# Patient Record
Sex: Male | Born: 1985 | Race: White | Hispanic: No | Marital: Married | Smoking: Never smoker
Health system: Southern US, Community
[De-identification: ages and names within clinical notes are randomized; demographics above are authoritative.]

## PROBLEM LIST (undated history)

## (undated) DIAGNOSIS — J984 Other disorders of lung: Secondary | ICD-10-CM

## (undated) DIAGNOSIS — J45909 Unspecified asthma, uncomplicated: Secondary | ICD-10-CM

## (undated) DIAGNOSIS — J302 Other seasonal allergic rhinitis: Secondary | ICD-10-CM

## (undated) HISTORY — PX: OTHER SURGICAL HISTORY: SHX169

## (undated) HISTORY — DX: Other seasonal allergic rhinitis: J30.2

## (undated) HISTORY — PX: HX FOOT SURGERY: 2100001154

## (undated) HISTORY — DX: Other disorders of lung: J98.4

---
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
## 2009-01-09 ENCOUNTER — Inpatient Hospital Stay
Admission: EM | Admit: 2009-01-09 | Discharge: 2009-01-10 | DRG: 101 | Disposition: A | Discharge status: Home / Self Care | Payer: Self-pay | Attending: Neurology | Admitting: Neurology

## 2009-01-09 ENCOUNTER — Emergency Department (EMERGENCY_DEPARTMENT_HOSPITAL): Payer: Self-pay

## 2009-01-09 ENCOUNTER — Encounter (HOSPITAL_COMMUNITY): Payer: Self-pay | Admitting: Neurology

## 2009-01-09 ENCOUNTER — Encounter (HOSPITAL_COMMUNITY): Payer: Self-pay | Admitting: Nurse Practitioner

## 2009-01-09 LAB — VENOUS BLOOD GAS/K+/ICA++/CO-OX - INACTIVE
%FIO2: 100 %
%FIO2: 21 %
BASE DEFICIT: 24.9 mmol/L — ABNORMAL HIGH (ref 0.0–2.0)
BASE DEFICIT: 11.1 mmol/L — ABNORMAL HIGH (ref 0.0–2.0)
BICARBONATE: 5.4 mmol/L — CL (ref 22.0–26.0)
BICARBONATE: 14.8 mmol/L — CL (ref 22.0–26.0)
CARBOHYHEMOGLOBIN: 2.5 % — ABNORMAL HIGH (ref 0.0–1.5)
CARBOHYHEMOGLOBIN: 2.2 % — ABNORMAL HIGH (ref 0.0–1.5)
HEMOGLOBIN: 17.4 g/dL (ref 13.5–18.0)
HEMOGLOBIN: 16 g/dL (ref 13.5–18.0)
IONIZED CALCIUM: 1.32 mmol/L (ref 1.30–1.46)
IONIZED CALCIUM: 1.16 mmol/L — ABNORMAL LOW (ref 1.30–1.46)
MET-HEMOGLOBIN: 1.1 % (ref 0.0–3.0)
MET-HEMOGLOBIN: 0.1 % (ref 0.0–3.0)
O2CT: 23.5 — ABNORMAL HIGH (ref 7.5–17.5)
O2CT: 11.5 (ref 7.5–17.5)
O2HB: 94.6 % (ref 40.0–70.0)
O2HB: 51.4 % (ref 40.0–70.0)
PH: 6.87 — CL (ref 7.310–7.410)
PH: 7.18 — CL (ref 7.310–7.410)
PO2: 195 mmHg — ABNORMAL HIGH (ref 35–50)
PO2: 33 mmHg — ABNORMAL LOW (ref 35–50)
WHOLE BLOOD K+: 4 mmol/L (ref 3.0–5.0)
WHOLE BLOOD K+: 4.5 mmol/L (ref 3.0–5.0)

## 2009-01-09 LAB — URINALYSIS WITH MICROSCOPIC REFLEX IF INDICATED
BILIRUBIN: NEGATIVE
GLUCOSE: NEGATIVE mg/dL
KETONES: 10 mg/dL — AB
LEUKOCYTES: NEGATIVE
NITRITE: NEGATIVE
PH URINE: 5 (ref 5.0–8.0)
PROTEIN: 30 mg/dL — AB
SPECIFIC GRAVITY, URINE: 1.011 (ref 1.005–1.030)
UROBILINOGEN: NORMAL mg/dL

## 2009-01-09 LAB — DRUG SCREEN, HIGH OPIATE CUTOFF, NO CONFIRMATION, URINE
AMPHETAMINE, URINE: NEGATIVE
BARBITURATE, URINE: NEGATIVE
BENZODIAZEPINE,URINE: NEGATIVE
CANNABINOID (THC), URINE: POSITIVE — AB
COCAINE METAB. URINE: NEGATIVE
METHADONE, URINE: NEGATIVE
OPIATE, URINE: NEGATIVE
PHENCYCLIDINE, URINE: NEGATIVE
PROPOXYPHENE, URINE: NEGATIVE

## 2009-01-09 LAB — CBC/DIFF
BANDS: 1 % (ref 0–5)
BASOS ABS: 0 THOU/uL (ref 0.0–0.2)
EOS ABS: 0 THOU/uL — ABNORMAL LOW (ref 0.1–0.3)
EOSINOPHIL: 0 % — ABNORMAL LOW (ref 1–6)
HCT: 52.4 % — ABNORMAL HIGH (ref 39.8–50.2)
HGB: 16.9 g/dL (ref 13.1–17.3)
LYMPHOCYTES: 17 % — ABNORMAL LOW (ref 20–45)
LYMPHS ABS: 4.029 THOU/uL (ref 1.0–4.8)
MCH: 30.3 pg (ref 27.4–33.0)
MCHC: 32.3 g/dL (ref 31.6–35.5)
MCV: 93.9 fL (ref 82.0–99.0)
MONOS ABS: 1.659 THOU/uL — ABNORMAL HIGH (ref 0.1–0.9)
MPV: 8.5 FL (ref 7.4–10.4)
PLATELET COUNT: 258 THO/UL (ref 140–450)
PMN ABS: 17.775 THOU/uL — ABNORMAL HIGH (ref 1.5–7.7)
PMN'S: 75 % (ref 40–75)
RBC: 5.59 MIL/uL (ref 4.46–5.70)
TOTAL CELL COUNT: 200
WBC: 23.7 THOU/UL — ABNORMAL HIGH (ref 3.5–11.0)

## 2009-01-09 LAB — PT/INR
INR: 1.1 (ref 0.8–1.2)
PROTHROMBIN TIME: 11.1 s (ref 9.1–11.2)

## 2009-01-09 LAB — BASIC METABOLIC PANEL
ANION GAP: 20 mmol/L — ABNORMAL HIGH (ref 5–16)
BUN: 8 mg/dL (ref 8–26)
CARBON DIOXIDE: 11 mmol/L — ABNORMAL LOW (ref 23–33)
GLUCOSE,NONFAST: 127 mg/dL (ref 65–139)

## 2009-01-09 LAB — URINALYSIS, MICROSCOPIC
RBC'S: NONE SEEN /HPF (ref 0–4)
WBC'S: 1 /HPF (ref 0–1)

## 2009-01-09 LAB — ALT (SGPT): ALT (SGPT): 18 U/L (ref 7–55)

## 2009-01-09 LAB — AST (SGOT): AST (SGOT): 28 U/L (ref 8–48)

## 2009-01-09 MED ORDER — LORAZEPAM 2 MG/ML INJECTION SOLUTION
INTRAMUSCULAR | Status: AC
Start: 2009-01-09 — End: 2009-01-09
  Administered 2009-01-09: 1 mg via INTRAMUSCULAR
  Filled 2009-01-09: qty 1

## 2009-01-09 MED ORDER — ONDANSETRON HCL (PF) 4 MG/2 ML INJECTION SOLUTION
INTRAMUSCULAR | Status: AC
Start: 2009-01-09 — End: 2009-01-09
  Administered 2009-01-09: 4 mg via INTRAMUSCULAR
  Filled 2009-01-09: qty 2

## 2009-01-09 MED ORDER — PHENOBARBITAL 30 MG TABLET
30.0000 mg | ORAL_TABLET | Freq: Two times a day (BID) | ORAL | Status: DC
Start: 2009-01-09 — End: 2009-01-10
  Administered 2009-01-09: 30 mg via ORAL
  Filled 2009-01-09 (×3): qty 1

## 2009-01-09 MED ORDER — ONDANSETRON HCL (PF) 4 MG/2 ML INJECTION SOLUTION
4.0000 mg | Freq: Once | INTRAMUSCULAR | Status: AC
Start: 2009-01-09 — End: 2009-01-09

## 2009-01-09 MED ORDER — PHENOBARBITAL SODIUM 130 MG/ML INJECTION SYRINGE
100.0000 mg | INJECTION | Freq: Once | INTRAMUSCULAR | Status: AC
Start: 2009-01-09 — End: 2009-01-09
  Administered 2009-01-09: 100 mg via INTRAVENOUS
  Filled 2009-01-09 (×2): qty 0.77

## 2009-01-09 MED ORDER — LORAZEPAM 2 MG/ML INJECTION SOLUTION
1.0000 mg | Freq: Once | INTRAMUSCULAR | Status: AC
Start: 2009-01-09 — End: 2009-01-09

## 2009-01-09 NOTE — ED Nurses Note (Signed)
Pt's mother was yelling for help, pt was actively seizing.  Pt was placed on non-rebreather and was suctioned. Pt was ordered and given 1mg  of Ativan IV per Dr. Dawson Bills.  Pt was moved to monitored room and pt report was given to Synetta Shadow RN.  Pt's mother is at bedside and pt and family denies any needs at this time.

## 2009-01-09 NOTE — ED Provider Notes (Signed)
HPI  chief complaint  Seizure    Pt with first seizure 1.5 year ago.  Initally put on depakote.  No imaging per family.  Has not seen neurologist.  Stopped his meds 1 year ago.  Last sz was 1 month ago.  Today had 2 sz.  Similar to past.  First lasted 10 minutes.  Soon after had second that lasted 1 minute.  Full body convulsions per family.  Post ictal after with some nausea.         ROS  Review of systems:    General: No fever, chills, night sweats  HEENT:  No headache, change in vision  CHEST:  No chest pain, sob,   Heart:  No Palpitations, diaphoresis, cough  GI:  No  diarrhea  GU:  No dysuria, hematuria,   Skin:  No rash,    MS:  No swollen joints  Psych:  No homicidal ideation  All other systems reviewed and negative      History: Past Medical History:  Past Medical History   Diagnosis Date   . Seizures          Past Surgical History:  History reviewed.  No pertinent past surgical history.    Social History:  History   Substance Use Topics   . Tobacco Use: Yes -- 1.0 packs/day for 3 years   . Alcohol Use: No       History   Drug Use No         Family History:  History reviewed.  No pertinent family history.          Physical Exam  PHysical:    Constitutional: Blood pressure 125/60, pulse 74, temperature 37.6 C (99.7 F), resp. rate 18, height 1.829 m (6'), weight 93.8 kg (206 lb 12.7 oz), SpO2 97%.    General:  Well appearing, no acute distress  HEENT:  Normocephalic, mucous membranes moist, conjunctiva normal   Neck:  Supple, no masses, trachea midline  Chest: no deformities,    Respiratory:  Nonlabored breathing, equal lung sounds, CTABL,   CV:  Regular rate and rythym,  Distal pulses intact, abdominal aorta wnl  ABD: soft, nontender, nondistented, no masses  MS:  Normal rom, no deformities, normal tone   Neuro:  Pupils equal, no facial asymetry, normal speech, movement x4 ext  Psych:  Normal affect,  Linear thought  Skin: warm, no palor          Course  Orders Placed This Encounter    . Ct brain wo iv contrast   . Venous blood gas/k+/ica++/coox   . Cbc/diff   . Basic metabolic panel, non-fasting   . Ast (sgot)   . Alt (sgpt)   . Pt/inr   . Urine drug screen complete   . Urinalysis w/culture (inpt routine)   . Venous blood gas/k+/ica++/coox   . Microscopic urinalysis   . Patient class/level of care designation   . Lorazepam 2 mg/ml injection   . Lorazepam 2 mg/ml injection   . Ondansetron hcl (pf) 4 mg/2 ml injection   . Ondansetron hcl (pf) 4 mg/2 ml injection   . Phenobarbital sodium 130 mg/ml syringe   . Phenobarbital 30 mg tab         Labs reviewed  Imaging reviewed  Consults neurology    Pt with witnessed seizure in ed.  Given 1mg  ativan.  Resolved.  Ct brain negative.  Labs abn but can be explained by sz.  Neurology consulted    Pt care  to dr Artis Flock with admit pending.

## 2009-01-09 NOTE — ED Resident Handoff Note (Signed)
Care of patient assumed from Dr. Maryjane Hurter at 5:37 PM with CT and neuro recs pending prior to disposition.    23 yo M presenting with seizure. Has seizure history, off meds 1 year. 2 seizures today and 1 in ED. Had 1 mg ativan. Neuro consulted. CT brain pending. Allergic to dilantin. Previously on depakote.   CT with sinus disease. Admitted to neuro. Stable throughout ED course.     Estill Bakes, MD 01/09/2009, 5:37 PM

## 2009-01-09 NOTE — Nurses Notes (Signed)
Received report from ED RN Irving Burton. Patient arrived on unit via bed accompanied by RN/Mother. Patient in stable condition. Mother stated that his seizures consist of him "holding his breath and turning purple." She stated that he would shake his extremities and his mouth will foam. He was incontinent of urine in the ED here at [facility]. Frequency varies but they are more frequent now. Durations varies but mother stated usually it is anywhere from 30 sec. To a few minutes. Patient is confused after seizure, drowsy, and develops a headache. Seizures were diagnosed a few years ago and he was on Depakote but stopped the medicine over a year ago. Most recent seizure was this afternoon in [facility] ED. Assessment complete per flowsheet. VSS. SATS 95-100% on RA. Notified service if patient needed to have EEG monitoring. Service stated that he doesn't want him on video EEG because he is a "known diagnosis." Oriented family member and patient to the room, bed controls and how to notify me if he does have a seizure. They verbalized understanding. Family member stated she is staying with him tonight. Seizure precautions in place. Patient only complaining of a headache 8/10 on pain scale but declines any pain medication at this time. Will cont. To monitor.

## 2009-01-09 NOTE — ED Nurses Note (Signed)
Initial nursing assessment complete. Pt resting in bed with family at bedside. Pt was seen at [facility] and stated he signed out AMA and states he got home and called an Ambulance to bring him here. Pt states he had a seizure this morning that was witnessed and lasted 10 seconds. Pt was vomiting when he came here and denies any pain.  Pt is conscious, alert and oriented to person, place and time.  Pt is waiting for physician evaluation.

## 2009-01-09 NOTE — H&P (Addendum)
[facility]  Neurology Admission  History and Physical    [name],[name], 23 y.o. male  Date of Admission:  01/09/2009  Date of Birth:  18-Nov-1985    PCP: No Established Pcp    Information obtained from: patient, mother, father   Chief Complaint:  seizure    [id number] [name] [name] is a 23 y.o., White male who presents with 3 episode of seizure-like episodes.  First episode, at 11:00 AM on 01/09/09, where patient woke from asleep and had shaking of arms.  He also had foaming at the mouth.  This episode lasted 10 minutes.  He was confused afterwards.  He presented to hospital in [place], [place] and was unhappy with care and left.  Had a second episode at 1:00 PM where his arms went stiff and began shaking.  His eyes rolled to the back of his head.  This episode lasted 3 minutes.  He was again confused after this episode.  Third episode was in the [facility] ED, where patient's arms went stiff had foaming at the mouth and had urinary incontinence. Denies recent infection.   He was diagnosed with seizures 1.5 years ago and was on Depakote 500 mg BID in the past.  However, he stopped taking this over a year ago, due to financial reasons and he did not feel himself when on this medication.  He had a seizure 1 month ago and was started on Dilantin and developed a rash.  He did not start any AED at that time. UDS positive for cannabis.        Past Medical History   Diagnosis Date   . Seizures        PSH - denies      Medications - denies        Allergies   Allergen Reactions   . Dilantin (Phenytoin)    . Tussin (Guaifenesin)        History   Substance Use Topics   . Tobacco Use: Yes -- 1.0 packs/day   . Alcohol Use: No       Family History - Mother - cancer  Father -denies  Brother - seizure disorder        ROS: Other than ROS in the HPI, all other systems were negative.    Exam:  Temperature: 37.1 C (98.8 F)  Heart Rate: 79   BP (Non-Invasive): 130/86 mmHg  Respiratory Rate: 18   SpO2-1: 97 %   General: no distress  HENT:Head atraumatic and normocephalic  Neck: No JVD  Carotids:Carotids normal without bruit  Lungs: Clear to auscultation bilaterally.   Cardiovascular: regular rate and rhythm  Abdomen: Soft, non-tender  Extremities: No cyanosis or edema  Ophthalomscopic: normal w/o hemorrhages, exudates, or papilledema  Mental status:  Level of Consciousness: alert  Orientations: To person and place, did not know year  Memory Registration, Recall, and Following of commands is normal  AttentionsAttention and Concentration are normal  Knowledge: Good  Language: Normal  Speech: Normal  Cranial nerves:   CN2: Visual acuity and fields intact  CN 3,4,6: EOMI, PERRLA  CN 5Facial sensation intact  CN 7Face symmetrical  CN 8: Hearing grossly intact  CN 9,10: Palate symmetric and gag normal  CN 11: Sternocleidomastoid and Trapezius have normal strength.  CN 12: Tongue normal with no fasiculations or deviation  Gait, Coordination, and Reflexes:   Gait: Normal  Coordination: Coordination is normal without tremor  Reflexes: symmetric bilaterally  Motor:   Muscle tone:  Upper and lower muscle  tone is normal  Motor strength:  Motor strength is normal throughout.  Sensory: Sensory exam in the upper and lower extremities is normal  Labs:      BMP:     Lab Results   Basename Value Date/Time   . SODIUM 138 01/09/09 1515   . POTASSIUM 3.6 01/09/09 1515   . CHLORIDE 107 01/09/09 1515   . CO2 11 01/09/09 1515   . BUN 8 01/09/09 1515   . CREATININE 1.13 01/09/09 1515   . GLUCOSENF 127 01/09/09 1515   . ANIONGAP 20 01/09/09 1515   . BUNCRRATIO 7 01/09/09 1515   . GFR >59 01/09/09 1515   . CALCIUM 9.4 01/09/09 1515     CBC with Differential:     Lab Results   Basename Value Date/Time   . WBC 23.7 01/09/09 1515   . HGB 16.9 01/09/09 1515   . HCT 52.4 01/09/09 1515   . PLTCNT 258 01/09/09 1515   . RBC 5.59 01/09/09 1515   . MCV 93.9 01/09/09 1515   . MCHC 32.3 01/09/09 1515   . MCH 30.3 01/09/09 1515   . RDW 12.2 01/09/09 1515   . MPV 8.5 01/09/09 1515    . PMNS 75 01/09/09 1515   . LYMPHOCYTES 17 01/09/09 1515   . EOSINOPHIL 0 01/09/09 1515   . MONOCYTES 7 01/09/09 1515   . BASOPHILS 0 01/09/09 1515   . BANDS 1 01/09/09 1515         Review of reports and notes reveal:     Independent Interpretation of images or specimens:  CT Brain  No acute process    DNR Status:  Full Code    Assessment/Plan:  23 year old male with  uncontrolled likely Tonic-Clonic seizures.   - Admit patient to Neurology service.   - Seizure precautions   - Will start Phenobarbital 100 mg IV   - Will start Phenobarbital 30 mg BID   - Seizure Precautions     2. Leukocytosis   - Likely related to seizure    2. Diet   - Regular        DVT/PE Prophylaxis: not needed    Uvaldo Bristle, MD 01/09/2009, 6:33 PM    Uvaldo Bristle, MD  PGY - 2 Department of Neurology  [facility]      I saw and examined the patient.  I reviewed the resident's note.  I agree with the findings and plan of care as documented in the resident's note.  Any exceptions/additions are edited/noted.    Jamse Arn, MD 01/10/2009, 11:36 AM

## 2009-01-09 NOTE — ED Attending Note (Signed)
Note begun by:  Danise Mina, MD 01/09/2009, 3:15 PM    I was physically present and directly supervised this patient's care.  Patient seen and examined.  Resident history and exam reviewed.  Key elements in addition to and/or correction of that documentation are as follows:    HPI:    23 y.o. male presents for seizure activity.  Occurred X 2 today.  H/o sz approx 1.5 years ago and was seen in an ED at [facility] and tx with dilantin.  Not taking dilantin since.  Denies any trauma, headache, or ext weakness. Further historical details can be found in the resident's note.    PE:   VS on presentation: Blood pressure 130/86, pulse 79, temperature 37.1 C (98.8 F), resp. rate 18, weight 83.915 kg (185 lb), SpO2 97%.    Data/Tests:  Labs:    Results for orders placed during the hospital encounter of 01/09/2009 (from the past 12 hours)   URINE DRUG SCREEN COMPLETE   Component Value Range   . AMPHETAMINE, URINE NEGATIVE  NEGATIVE    . BARBITURATE, URINE NEGATIVE  NEGATIVE    . BENZODIAZEPINE,URINE NEGATIVE  NEGATIVE    . COCAINE METAB. URINE NEGATIVE  NEGATIVE    . METHADONE, URINE NEGATIVE  NEGATIVE    . OPIATE, URINE NEGATIVE  NEGATIVE    . PHENCYCLIDINE, URINE NEGATIVE  NEGATIVE    . PROPOXYPHENE, URINE NEGATIVE  NEGATIVE    . CANNABINOID (THC), URINE POSITIVE (*) NEGATIVE    URINALYSIS W/CULTURE (INPT ROUTINE)   Component Value Range   . CHARACTER CLEAR     . COLOR LIGHT YELLOW     . SPECIFIC GRAVITY, URINE 1.011  1.005 - 1.030    . GLUCOSE NEGATIVE  NEGATIVE (mg/dL)   . BILIRUBIN NEGATIVE  NEGATIVE    . KETONES 10 (*) NEGATIVE (mg/dL)   . BLOOD SMALL (*) NEGATIVE    . PH URINE 5.0  5.0 - 8.0    . PROTEIN 30 (*) NEGATIVE (mg/dL)   . UROBILINOGEN NORMAL  0.2 (mg/dL)   . NITRITE NEGATIVE  NEGATIVE    . LEUKOCYTES NEGATIVE  NEGATIVE    VENOUS BLOOD GAS/K+/ICA++/COOX   Component Value Range   . WHOLE BLOOD K+ 4.5  3.0 - 5.0 (mmol/L)   . IONIZED CALCIUM 1.16 (*) 1.30 - 1.46 (mmol/L)   . %FIO2 21  (%)    . PH 7.180 (*) 7.310 - 7.410    . PCO2 46.0  41.0 - 51.0 (mm Hg)   . PO2 33 (*) 35 - 50 (mm Hg)   . BICARBONATE 14.8 (*) 22.0 - 26.0 (mmol/L)   . BASE EXCESS Test Not Performed  0.0 - 2.0 (mmol/L)   . BASE DEFICIT 11.1 (*) 0.0 - 2.0 (mmol/L)   . Specimen Type VENOUS     . HEMOGLOBIN 16.0  13.5 - 18.0 (g/dL)   . O2HB 51.4  40.0 - 70.0 (%)   . CARBOHYHEMOGLOBIN 2.2 (*) 0.0 - 1.5 (%)   . MET-HEMOGLOBIN 0.1  0.0 - 3.0 (%)   . O2CT 11.5  7.5 - 17.5    . RTCXV SPEC TYPE VENOUS     MICROSCOPIC URINALYSIS   Component Value Range   . RBC'S    0 - 4 (/HPF)    Value: NONE SEEN- MICROSCOPIC ANALYSIS PERFORMED BY AUTOMATED METHOD   . WBC'S 1  0 - 1 (/HPF)   . BACTERIA OCCASIONAL OR LESS  OCL    . Mucous LIGHT  (/  LPF)           EKG:   Prior EKG:     Images Reviewed by me (with my interpretation):       Image Reports Reviewed by me:       Transfer Docs/Images:     Clinical Impression:   Seizure    MDM/ED Course:   Patient was evaluated and labs ordered.  No focal neuro findings on exam.  Patient had witness tonic clonic seizure activity in the ED with tongue biting.  Received ativan IV.  Seizure activity stopped.  CT without acute intracranial pathology.  Neurology consulted to evaluate and admit patient.        Plan:       Dispo:      Procedures Performed:  None.     CRITICAL CARE: Total critical care time spent in direct care of this patient at high risk of hemodynamic and neurologic compromise based on presenting history/exam/and complaint, including initial evaluation and stabilization, review of data, re-examination, discussion with admitting and consulting services to arrange definitive care, and exclusive of any procedures performed, was 30 minutes.

## 2009-01-09 NOTE — ED Nurses Note (Signed)
Report called to 9E receiving nurse.

## 2009-01-09 NOTE — ED Attending Handoff Note (Signed)
Admitted for seizure, not taking dilantin  Admitted to neuro  Got ativan, loading with phenobarb

## 2009-01-09 NOTE — ED Nurses Note (Signed)
Transported to and from CT scan via cart by one RN.

## 2009-01-10 DIAGNOSIS — R569 Unspecified convulsions: Secondary | ICD-10-CM

## 2009-01-10 DIAGNOSIS — D72829 Elevated white blood cell count, unspecified: Secondary | ICD-10-CM

## 2009-01-10 LAB — CBC/DIFF
BASOS ABS: 0.055 THOU/uL (ref 0.0–0.2)
EOS ABS: 0.12 THOU/uL (ref 0.1–0.3)
HCT: 44.6 % (ref 39.8–50.2)
HGB: 15.1 g/dL (ref 13.1–17.3)
LYMPHOCYTES: 19 % — ABNORMAL LOW (ref 20–45)
LYMPHS ABS: 2.11 THOU/uL (ref 1.0–4.8)
MCH: 30.3 pg (ref 27.4–33.0)
MCHC: 33.8 g/dL (ref 31.6–35.5)
MCV: 89.6 fL (ref 82.0–99.0)
MONOS ABS: 0.947 THOU/uL — ABNORMAL HIGH (ref 0.1–0.9)
MPV: 8.7 FL (ref 7.4–10.4)
NRBC'S: 0 /100{WBCs}
PLATELET COUNT: 206 THO/UL (ref 140–450)
PMN ABS: 7.98 THOU/uL — ABNORMAL HIGH (ref 1.5–7.7)
PMN'S: 72 % (ref 40–75)
RBC: 4.98 MIL/uL (ref 4.46–5.70)
WBC: 11.2 THOU/UL — ABNORMAL HIGH (ref 3.5–11.0)

## 2009-01-10 MED ORDER — PHENOBARBITAL 100 MG TABLET
50.00 mg | ORAL_TABLET | Freq: Two times a day (BID) | ORAL | Status: DC
Start: 2009-01-10 — End: 2009-01-11
  Administered 2009-01-10: 50 mg via ORAL
  Filled 2009-01-10 (×4): qty 0.5

## 2009-01-10 MED ORDER — PHENOBARBITAL 100 MG TABLET
50.00 mg | ORAL_TABLET | Freq: Two times a day (BID) | ORAL | Status: DC
Start: 2009-01-10 — End: 2009-10-11

## 2009-01-10 NOTE — Care Management Notes (Addendum)
======================================================================    Patient Name: [name] [name]  DOB: [birth year]  Age: 23   Account Number: [id number]  MR Number: [id number]  ======================================================================  Admission Information  Encounter Type: Inpatient  Patient Type: INPATIENT  Admit Date: 01/09/2009  Admit Time: 14:42  Admit Reason: seizure  Admitting Phys: Dian Queen MD  Unit: 9 EAST  Room/Bed: 967 / A  ======================================================================  Discharge Information  Actual D/C Date: 01/10/2009  Actual LOS: 1  ADT Disch/Disp: HOME/SELF CARE (Code 01)  ======================================================================  Assessment Information  Status: Completed 01/12/2009  Discharge Manager: Dellis Anes [id number]  Transition Manager:

## 2009-01-11 ENCOUNTER — Ambulatory Visit (INDEPENDENT_AMBULATORY_CARE_PROVIDER_SITE_OTHER): Payer: Self-pay | Admitting: Neurology

## 2009-01-11 NOTE — Telephone Encounter (Signed)
Staff  note says 50mg  phenobarb bid in one place and 30mg  bid in another    Please advise

## 2009-01-12 NOTE — Telephone Encounter (Signed)
Received a nursing triage note, that mother wanted to clarify the dosage of phenobarbital for [name] [name].  I called back and spoke with patient to take 50 mg BID.    Uvaldo Bristle, MD  PGY - 2 Department of Neurology  [facility]

## 2009-01-13 NOTE — Discharge Summary (Signed)
[facility]                                              DISCHARGE SUMMARY      PATIENT NAME:  [name],[name]   MRN:  [id number]  DOB:  02-Apr-1986    ADMISSION DATE:  01/09/2009  DISCHARGE DATE:  01/10/2009  ATTENDING PHYSICIAN: Jamse Arn, MD  PRIMARY CARE PHYSICIAN: No Established Pcp     ADMISSION DIAGNOSIS: Seizures  DISCHARGE DIAGNOSIS:   Tonic- clonic seizures    Hospital Problems    Seizures         Date Noted: 01/10/2009    Frontal sinusitis         Date Noted: 01/10/2009    Resolved Hospital Problems  No resolved problems to display                                                                                                  DISCHARGE MEDICATIONS:  Discharge Medication List as of 01/10/2009 10:58 AM      START taking these medications   phenobarbital (LUMINAL) 100 mg Tab    50 mg, Oral, 2 TIMES DAILY starting 01/10/2009, Disp-60, R-3, Print            DISCHARGE INSTRUCTIONS:     AMB CONSULT/REFERRAL ENT     SCHEDULE FOLLOW-UP PHYSICIANS OFFICE [facility]ENTER   Follow-up appointment clinic: NEUROLOGY    Follow-up in: 5 WEEKS    Reason for visit: HOSPITAL DISCHARGE    Followup reason: seizure    Provider: Uvaldo Bristle             REASON FOR HOSPITALIZATION AND HOSPITAL COURSE:  This is a 23 y.o., male  who presents with 3 episode of seizure-like episodes. First episode, at 11:00 AM on 01/09/09, where patient woke from asleep and had shaking of his arms. He also had foaming at the mouth. This episode lasted 10 minutes. He was confused afterwards. He presented to a hospital in [place], [place] and was unhappy with care and left. He had a second episode at 1:00 PM where his arms went stiff and began shaking. His eyes rolled to the back of his head. This episode lasted 3 minutes. It was witnessed by his father.  He was again confused after this episode. Third episode was in the [facility] ED, where patient's arms went stiff had foaming at the mouth and had urinary incontinence. He denies recent infection.    He was diagnosed with seizures 1.5 years ago and was on Depakote 500 mg BID in the past. However, he stopped taking this over a year ago, due to financial reasons and he did not "feel himself" when on this medication. He had a seizure 1 month ago and was started on Dilantin and developed a rash. He did not start any AED at that time. His UDS today is positive for cannabis.   Patient's CT brain did not show an acute process but showed a left  frontal sinus and anterior ethmoid air cells which could relate to frontal recess obstruction.  Patient's LFT showed AST of 28 and ALT of 18.  He had leukocytosis with WBC of 23.7 on admission and decreased to 11 the following day.  This was felt to be related to his seizure activity.  Patient was loaded with phenobarbital and was started on phenobarbital 50 mg oral BID.  While admitted, patient did not have any further episodes of seizure like activity.  Patient tolerated the medication and it was decided to discharge patient with this dosage.  He will follow up in Neurology clinic in 5 weeks.  He will also follow up with ENT for the left frontal recess obstruction.        CONDITION ON DISCHARGE:   A. Ambulation:Ambulatory  B. Self-care Ability: Full  C. Cognitive Status Alert & oriented    DISCHARGE DISPOSITION:  Home discharge     cc: Primary Care Physician:  No address on file.     [id number] Physician:  No address on file.       Uvaldo Bristle, MD  PGY - 2 Department of Neurology  [facility]

## 2009-01-20 ENCOUNTER — Ambulatory Visit (INDEPENDENT_AMBULATORY_CARE_PROVIDER_SITE_OTHER): Payer: Self-pay | Admitting: Neurology

## 2009-01-20 ENCOUNTER — Other Ambulatory Visit (INDEPENDENT_AMBULATORY_CARE_PROVIDER_SITE_OTHER): Payer: Self-pay | Admitting: Neurology

## 2009-01-21 ENCOUNTER — Ambulatory Visit (INDEPENDENT_AMBULATORY_CARE_PROVIDER_SITE_OTHER): Payer: Medicaid Other | Admitting: Allergy & Immunology

## 2009-01-21 NOTE — Progress Notes (Signed)
I saw and evaluated the patient. I reviewed the resident's note. I agree with the findings and plan of care as documented in the resident's note. Any exceptions/additions are noted.      Gurkaran Rahm, MD 01/21/2009, 1:17 PM

## 2009-01-31 ENCOUNTER — Encounter (INDEPENDENT_AMBULATORY_CARE_PROVIDER_SITE_OTHER): Payer: Self-pay | Admitting: Otolaryngology

## 2009-01-31 ENCOUNTER — Ambulatory Visit (INDEPENDENT_AMBULATORY_CARE_PROVIDER_SITE_OTHER): Payer: Medicaid Other | Admitting: Otolaryngology

## 2009-01-31 VITALS — BP 130/75 | HR 66 | Temp 98.5°F | Ht 68.0 in | Wt 198.0 lb

## 2009-01-31 MED ORDER — FLUTICASONE PROPIONATE 50 MCG/ACTUATION NASAL SPRAY,SUSPENSION
2.0000 | Freq: Every day | NASAL | Status: DC
Start: 2009-01-31 — End: 2009-10-11

## 2009-01-31 NOTE — H&P (Signed)
PATIENT NAME:  [name] [name]  MRN:  [id number]  DOB:  Jun 09, 1985  DATE OF SERVICE:  01/31/2009      Chief Complaint:  Chief Complaint   Patient presents with   . Sinus Problem         HPI:  [name] [name] is a 23 y.o. male with history of seizures, was admitted on 01/09/09 to neurology, he had a head CT that showed complete left frontal sinus obstruction. He is not complaining of any nasal symptoms including no nasal obstruciton, no hyposmia, no nasal drainage. He does smoke, he used to have asthma when he was younger. He did have a sinus CT in 2008 that showed partial obstruction of that sinus.    Past Medical History:  Past Medical History   Diagnosis Date   . Seizures    . Apical lung scarring    . Asthma          Past Surgical History:  Past Surgical History   Procedure Date   . Hx foot surgery          Medications:  Current outpatient prescriptions   Medication Sig   . phenobarbital (LUMINAL) 100 mg Tab take 0.5 Tabs by mouth Twice daily.          Allergies:  Allergies   Allergen Reactions   . Dilantin (Phenytoin)    . Tussin (Guaifenesin)          Family History:  Family History   Problem Relation   . Cancer Maternal Grandmother   . Diabetes Maternal Grandfather   . Heart Disease Maternal Grandfather   . Hypertension Maternal Grandfather   . Diabetes Paternal Grandmother         Social History:  History   Tobacco Use   . Yes -- 1.0 packs/day for 6.0 years       History   Alcohol Use   . Yes           Review of Systems:  Other than ROS in the HPI, all other systems were negative.    Physical Exam:  Filed Vitals:    01/31/2009  2:14 PM   BP: 130/75   Pulse: 66   Temp: 36.9 C (98.5 F)   Height: 1.727 m (5\' 8" )   Weight: 89.812 kg (198 lb)       General Appearance: Pleasant, cooperative, no distress.  Eyes: Conjunctiva clear.  Head and Face: Facies symmetric, no obvious lesions.  External Ears:Normal pinnae shape and position.  External Auditory Canal - Left: Patent without inflammation.   External Auditory Canal - Right: Patent without inflammation.  Tympanic Membrane - Left: Intact, translucent, midposition, middle ear aerated.  Tympanic Membrane - Right: Intact, translucent, midposition, middle ear aerated.  Nose: External pyramid midline.  Septum midline.  Mucosa normal.  No purulence, polyps, or crusts.   Oral Cavity/Oropharynx: No mucosal lesions, masses, or pharyngeal asymmetry.  Nasopharynx: Deferred.  Hypopharynx/Larynx: Not well visualized  Neck:: No cervical adenopathy. No palpable thyroid or salivary gland masses.   Thyroid: No significant thyroid abnormality by palpation.  Skin: Skin warm and dry  Neurologic: Grossly normal    Nasal endoscopy: 30 degree nasal endoscope no polyps massess seen bilateral the septum is deflected anteriorly to the right, has a mild high left deflection.    Assessment:  Left frontal sinusitis      Plan:  The patient will be having an MRI next month. We will place him on flonase and see him back in  2 months with a dedicated sinus CT, as he is not having any drainage we will not place him on antibiotics.        Darcey Nora, MD  [facility]

## 2009-02-22 ENCOUNTER — Ambulatory Visit (INDEPENDENT_AMBULATORY_CARE_PROVIDER_SITE_OTHER): Payer: 59 | Admitting: Neurology

## 2009-02-22 ENCOUNTER — Ambulatory Visit
Admission: RE | Admit: 2009-02-22 | Discharge: 2009-02-22 | Disposition: A | Discharge status: Home / Self Care | Payer: 59 | Attending: Neurology | Admitting: Neurology

## 2009-02-22 VITALS — BP 130/78 | HR 64 | Ht 69.0 in | Wt 196.0 lb

## 2009-02-22 MED ORDER — GADOPENTETATE DIMEGLUMINE 469.01 MG/ML (46.9 %) INTRAVENOUS SOLUTION
20.00 mL | INTRAVENOUS | Status: AC
Start: 2009-02-22 — End: 2009-02-22
  Administered 2009-02-22: 20 mL via INTRAVENOUS

## 2009-02-22 NOTE — Progress Notes (Addendum)
[place] Department of Neurology  Return Outpatient Note    Date:  02/22/2009  Age:  23 y.o.  Referring Physician:   [facility] EMERGENCY DEPT  [address]  [place] [address]    Subjective:   23 year old male who presents as follow up to [facility] Neurology clinic after being admitted for spells in October 2010.  At that time, he had 3 episodes of stiffening and shaking lasting up to 10 minutes with a post-ictal state. His mother states he was diagnosed 2 years ago in [place], [place] and it is unclear if he ever had an EEG done.  Patient did state that he was told he had abnormal brain waves.  Patient has tried Depakote and Dilantin in the past but could not tolerate due to feeling tired and development of rash respectively.  When admitted in October 2010, he was loaded with Phenobarbital and is currently on Phenobarbital 50 mg BID.  He denies further spells since being discharged.  He also reports being compliant with the medication and denies any side effects.    Objective:   Vital Signs:  BP 130/78   Pulse 64   Ht 1.753 m (5\' 9" )   Wt 88.905 kg (196 lb)  General: in no distress  Orientation: Oriented to person  Attention:  Follows commands  Language: Normal  Speech: Normal  Cranial nerves: Cranial nerves 2-12 are normal  Gait: Normal  Coordination: Coordination is normal without tremor  Sensory: Sensory exam in the upper and lower extremities is normal  Muscle tone: Upper and lower muscle tone is normal  Motor strength:  Motor strength is normal throughout.  Reflexes: symmetric      Data Reviewed:    I have reviewed the following: MRI Brain 02/22/2009    Independent Interpretation:  No acute process, no mass seen.    Assessment and Plan:  23 year old male with spells of stiffening and shaking with post-ictal state.   -MRI Brain normal   -Order EEG   - Continue Phenobarbital 50 mg BID    -Advised patient not to drive or operate any vehicle or machinery or participate in an activity which may put him or others around him in danger if he were to have another spell for 6 months from last spell.    Return to Neurology clinic in 3 months.      Uvaldo Bristle, MD 02/22/2009, 4:20 PM  Uvaldo Bristle, MD  PGY - 2 Department of Neurology  [facility]    02/23/2009  I saw and examined the patient.  I reviewed the resident's note.  I agree with the findings and plan of care as documented in the resident's note.  Any exceptions/additions are noted.    No further spells. Tolerating phenobarbital well. Not clear if these are seizures and mother even states they were told once they may be stress or passing out episodes. Will get and EEG to characterize spells further.     Hosie Poisson, MD 02/23/2009, 8:46 AM

## 2009-02-23 ENCOUNTER — Other Ambulatory Visit (INDEPENDENT_AMBULATORY_CARE_PROVIDER_SITE_OTHER): Payer: Self-pay

## 2009-03-14 ENCOUNTER — Other Ambulatory Visit (HOSPITAL_COMMUNITY): Payer: Self-pay

## 2009-04-04 ENCOUNTER — Encounter (INDEPENDENT_AMBULATORY_CARE_PROVIDER_SITE_OTHER): Payer: Medicaid Other | Admitting: Otolaryngology

## 2009-04-04 ENCOUNTER — Other Ambulatory Visit (HOSPITAL_COMMUNITY): Payer: Self-pay

## 2009-05-24 ENCOUNTER — Encounter (INDEPENDENT_AMBULATORY_CARE_PROVIDER_SITE_OTHER): Payer: 59 | Admitting: Neurology

## 2009-06-14 ENCOUNTER — Encounter (INDEPENDENT_AMBULATORY_CARE_PROVIDER_SITE_OTHER): Payer: Self-pay | Admitting: Neurology

## 2009-07-12 ENCOUNTER — Encounter (INDEPENDENT_AMBULATORY_CARE_PROVIDER_SITE_OTHER): Payer: Self-pay | Admitting: Neurology

## 2009-08-16 ENCOUNTER — Ambulatory Visit (INDEPENDENT_AMBULATORY_CARE_PROVIDER_SITE_OTHER): Payer: Self-pay | Admitting: Neurology

## 2009-08-16 NOTE — Telephone Encounter (Signed)
Please advise if you can see him tomorrow what we should do

## 2009-08-16 NOTE — Telephone Encounter (Signed)
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 Mom said she has to have someone with him at all times and would like to know if he could be seen asap. If he cannot be see right away she would like to know if he could at least have an EEG.        If mom does not answer the phone just leave a message and she will call you back.. She said she can't answer her phone at work. She would prefer to see someone in the morning after 9:00

## 2009-08-18 ENCOUNTER — Ambulatory Visit (INDEPENDENT_AMBULATORY_CARE_PROVIDER_SITE_OTHER): Payer: Self-pay | Admitting: Neurology

## 2009-08-18 NOTE — Telephone Encounter (Signed)
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 Mom said she has to have someone with him at all times and would like to know if he could be seen asap. If he cannot be see right away she would like to know if he could at least have an EEG.        If mom does not answer the phone just leave a message and she will call you back.. She said she can't answer her phone at work. She would prefer to see someone in the morning after 9:00

## 2009-08-18 NOTE — Telephone Encounter (Signed)
Please advise they are calling again

## 2009-08-19 ENCOUNTER — Ambulatory Visit (INDEPENDENT_AMBULATORY_CARE_PROVIDER_SITE_OTHER): Payer: Self-pay | Admitting: Neurology

## 2009-08-19 NOTE — Telephone Encounter (Signed)
Informed thanks, I hope he gets his tests on time.  ATT      Janalyn Rouse, LPN 5/95/63 87:56 AM Signed   Pt has no insurance sch him 30 days out for EEG and financial counselor called him and left message. She said he has been non complient with returning his financial documents since January .

## 2009-08-19 NOTE — Telephone Encounter (Signed)
Pt has no insurance sch him 30 days out for EEG and financial counselor called him and left message. She said he has been non complient with returning his financial documents since January .

## 2009-08-19 NOTE — Telephone Encounter (Signed)
Spoke with patient, he has not been taking his Phenobarbital because it was making his vision blurry.  He says he has been of the Phenobarbital for a while and is unable to tell me how long it has been. Informed patient that he is to notify clinic in future regarding medication adjustment.  Instructed him he is to not to drive or operate machinery or any activity which may put him or others around him in danger if he were to have another episode.  He is working with our financial counselors to get coverage, so he may come get an EEG and follow up in clinic.  Advised him to go to nearest ED, if he were to have another spell.    Uvaldo Bristle, MD  PGY - 2 Department of Neurology  [facility]

## 2009-09-26 ENCOUNTER — Ambulatory Visit
Admission: RE | Admit: 2009-09-26 | Discharge: 2009-09-26 | Disposition: A | Discharge status: Home / Self Care | Payer: Self-pay | Attending: Neurology | Admitting: Neurology

## 2009-09-26 DIAGNOSIS — R569 Unspecified convulsions: Secondary | ICD-10-CM | POA: Insufficient documentation

## 2009-10-11 ENCOUNTER — Encounter (INDEPENDENT_AMBULATORY_CARE_PROVIDER_SITE_OTHER): Payer: Self-pay | Admitting: Neurology

## 2009-10-11 ENCOUNTER — Ambulatory Visit (INDEPENDENT_AMBULATORY_CARE_PROVIDER_SITE_OTHER): Payer: Self-pay | Admitting: Neurology

## 2009-10-11 VITALS — BP 142/84 | HR 76 | Ht 70.0 in | Wt 200.0 lb

## 2009-10-11 NOTE — Progress Notes (Signed)
[place] Department of Neurology  Return Outpatient Note    Date:  10/11/2009  Age:  24 y.o.  Referring Physician:   SELF, REFERRAL  No address on file.    Subjective:   24 year old male who presents as follow up to [facility] Neurology clinic on October 11, 2009.  He was admitted for spells in October 2010.  At that time, he had 3 episodes of stiffening and shaking lasting up to 10 minutes with a post-ictal state prior to admission.  Patient has tried Depakote and Dilantin in the past but could not tolerate due to feeling tired and development of rash respectively.  He had an EEG on June 20 which was normal.  Had an MRI Brain which was also normal.  He was started on Phenobarbital 50 mg BID during his October admission but he said it made him feel like he was having deja vu and he has stopped taking 4 months back.  He says he had a spell 2 months ago where he stiffened and began shaking his extremities.  He does say he was under stress at that time due to job and financial difficulties.  Since that time, he has not had any spells.  His spells began about 4 years ago and he has had 20 spells in total with one where he bit his tongue.        Objective:   Vital Signs:  There were no vitals taken for this visit.  General: no distress  Ophthalomscopic: normal w/o hemorrhages, exudates, or papilledema  Carotids: Carotids normal without bruit  Orientation: Alert and oriented x 3  Memory: Registration, Recall, and Following of commands is normal  Attention: Attention and Concentration are normal  Knowledge: Good  Language: Normal  Speech: Normal  Cranial nerves: Cranial nerves 2-12 are normal  Gait: Normal  Coordination: Coordination is normal without tremor  Sensory: Sensory exam in the upper and lower extremities is normal  Muscle tone: Upper and lower muscle tone is normal  Motor strength:  Motor strength is normal throughout.  Reflexes: Reflexes are 2/2 throughout      Data Reviewed:     I have reviewed the following: 02/22/2009 MRI BRAIN    Independent Interpretation:  Normal    Assessment and Plan:  1) Spells   -Despite his negative EEG and MRI Brain, his history of over 20 spells including tongue biting are concerning for seizure.   -He was explained the importance of being on AED to lower further chance of seizure. He says he does not want to be on any medications because of the way they make him feel.   -He was offered a stay to the EMU to characterize the spells but he declined.   -Discussed with patient he is to not drive a vehicle, motorized or non-motorized for at least 6 months from latest spell.  He is advised to avoid activities such as but not limited to: swimming, taking baths, climbing, avoid heights or any activity which may put him or others around him in danger if he was to have another seizure.          Return to clinic in 3 months.    Uvaldo Bristle, MD 10/11/2009, 2:14 PM  Uvaldo Bristle, MD  PGY - 3 Department of Neurology  [facility]

## 2009-10-11 NOTE — Progress Notes (Signed)
I saw and evaluated the patient. I reviewed the resident's note. I agree with the findings and plan of care as documented in the resident's note. Any exceptions/additions are noted.      Jamse Arn, MD 10/11/2009, 3:10 PM

## 2009-10-31 ENCOUNTER — Ambulatory Visit (INDEPENDENT_AMBULATORY_CARE_PROVIDER_SITE_OTHER): Payer: Self-pay | Admitting: Neurology

## 2009-11-09 ENCOUNTER — Encounter (INDEPENDENT_AMBULATORY_CARE_PROVIDER_SITE_OTHER): Payer: Self-pay

## 2009-11-09 NOTE — Progress Notes (Signed)
I received an order for EMU.  I called [name] [name] twice. A message says he has a voice mail that has not been set up.  I mailed an EMU packet for admit on 11/22/2009.

## 2009-11-22 ENCOUNTER — Inpatient Hospital Stay (HOSPITAL_COMMUNITY): Payer: 59 | Admitting: Neurology

## 2009-11-22 ENCOUNTER — Encounter (HOSPITAL_COMMUNITY): Payer: Self-pay

## 2009-11-22 ENCOUNTER — Inpatient Hospital Stay
Admission: RE | Admit: 2009-11-22 | Discharge: 2009-11-23 | DRG: 101 | Disposition: A | Discharge status: Home / Self Care | Payer: 59 | Attending: Neurology | Admitting: Neurology

## 2009-11-22 DIAGNOSIS — G40909 Epilepsy, unspecified, not intractable, without status epilepticus: Principal | ICD-10-CM | POA: Diagnosis present

## 2009-11-22 LAB — HEPATIC FUNCTION PANEL
ALBUMIN: 3.9 g/dL (ref 3.5–4.8)
ALKALINE PHOSPHATASE: 54 U/L (ref 38–126)
ALT (SGPT): 21 U/L (ref 7–55)
AST (SGOT): 17 U/L (ref 8–48)
BILIRUBIN,CONJUGATED: 0.2 mg/dL (ref 0.0–0.3)
TOTAL PROTEIN: 6.5 g/dL (ref 6.4–8.3)

## 2009-11-22 LAB — CBC/DIFF
BASOPHILS: 1 % (ref 0–1)
BASOS ABS: 0.05 THOU/uL (ref 0.0–0.2)
HGB: 14.8 g/dL (ref 13.1–17.3)
LYMPHOCYTES: 28 % (ref 20–45)
MCH: 30 pg (ref 27.4–33.0)
MCHC: 34 g/dL (ref 31.6–35.5)
MCV: 88.4 fL (ref 82.0–99.0)
MONOCYTES: 7 % (ref 4–13)
MPV: 8.2 FL (ref 7.4–10.4)
NRBC'S: 0 /100{WBCs}
RBC: 4.92 MIL/uL (ref 4.46–5.70)

## 2009-11-22 LAB — BASIC METABOLIC PANEL
ANION GAP: 7 mmol/L (ref 5–16)
BUN/CREAT RATIO: 8 (ref 6–22)
BUN: 9 mg/dL (ref 8–26)
CALCIUM: 9.1 mg/dL (ref 8.5–10.4)
CARBON DIOXIDE: 24 mmol/L (ref 23–33)
CHLORIDE: 107 mmol/L (ref 96–111)
CREATININE: 1.08 mg/dL (ref 0.62–1.27)
ESTIMATED GLOMERULAR FILTRATION RATE: >59 ml/min/1.73m2 (ref >59)
GLUCOSE,NONFAST: 120 mg/dL (ref 65–139)
POTASSIUM: 4 mmol/L (ref 3.5–5.1)
SODIUM: 138 mmol/L (ref 136–145)

## 2009-11-22 MED ORDER — SODIUM CHLORIDE 0.9 % (FLUSH) INJECTION SYRINGE
1.00 mL | INJECTION | Freq: Three times a day (TID) | INTRAMUSCULAR | Status: DC
Start: 2009-11-22 — End: 2009-11-24
  Administered 2009-11-22 – 2009-11-23 (×3): 1 mL
  Administered 2009-11-23: 0 mL

## 2009-11-22 MED ORDER — SODIUM CHLORIDE 0.9 % (FLUSH) INJECTION SYRINGE
0.50 mL | INJECTION | INTRAMUSCULAR | Status: DC | PRN
Start: 2009-11-22 — End: 2009-11-24

## 2009-11-22 MED ORDER — SODIUM CHLORIDE 0.9 % (FLUSH) INJECTION SYRINGE
0.50 mL | INJECTION | Freq: Three times a day (TID) | INTRAMUSCULAR | Status: DC
Start: 2009-11-22 — End: 2009-11-22
  Administered 2009-11-22: 0.5 mL via INTRAVENOUS

## 2009-11-22 NOTE — Nurses Notes (Addendum)
Pt arrived from home for veeg monitoring.  Has had spells for the last couple of years. Makes a noise, lips smacking, stiffens, bites tongue lips turn blue, does not respond.  Postictal pt is drowsy, confused, weak.

## 2009-11-23 NOTE — Progress Notes (Addendum)
[facility]  Neurology Progress Note      [name] [name],[name] [name], 24 y.o. male  Date of Admission:  11/22/2009  Date of service: 11/23/2009  Date of Birth:  01/04/1986      Chief Complaint: Spells  Pt's condition today: stable    Subjective: No spells since admission.  Patient asking to be discharged home today.    Vital Signs:  Temp (24hrs) Max:36.8 C (98.2 F)      Systolic (24hrs), Avg:133 mmHg, Min:126 mmHg, Max:140 mmHg    Diastolic (24hrs), Avg:67 mmHg, Min:59 mmHg, Max:76 mmHg    Temp  Avg: 36.7 C (98 F)  Min: 36.5 C (97.7 F)  Max: 36.8 C (98.2 F)  Pulse  Avg: 54   Min: 49   Max: 63   Resp  Avg: 19.2   Min: 18   Max: 20   SpO2  Avg: 97.3 %  Min: 96 %  Max: 98 %  Pain Score: 0    Today's Physical Exam:  General:alert  Mental status:Alert and oriented x 3  Attention: Attention and Concentration are normal  Knowledge: Good  Language and Speech: Normal and Normal  Cranial nerves: Cranial nerves 2-12 are normal  Muscle tone: WNL  Motor strength:  Motor strength is normal throughout.  Sensory: Sensory exam in the upper and lower extremities is normal  Gait: Normal  Coordination: Coordination is normal without tremor  Reflexes: Reflexes are 2/2 throughout    Current Medications:    Current facility-administered medications:  NS 10 mL injection  1 mL Intracatheter Q8HRS   NS 10 mL injection  0.5 mL Intracatheter Q1 MIN PRN   DISCONTD: NS 10 mL injection  0.5 mL Intravenous Q8H         I/O:  I/O last 24 hours:    Intake/Output Summary (Last 24 hours) at 11/23/09 0855  Last data filed at 11/23/09 8119   Gross per 24 hour   Intake    720 ml   Output      0 ml   Net    720 ml       I/O current shift:       Labs  Please indicate ordered or reviewed)  Reviewed: Lab Results for Last 24 Hours:    Results for orders placed during the hospital encounter of 11/22/09 (from the past 24 hour(s))   CBC/DIFF    Collection Time    11/22/09 11:15 AM   Component Value Range   . WBC 8.0  3.5 - 11.0 (THOU/uL)    . RBC 4.92  4.46 - 5.70 (MIL/uL)   . HGB 14.8  13.1 - 17.3 (g/dL)   . HCT 43.5  39.8 - 50.2 (%)   . MCV 88.4  82.0 - 99.0 (fL)   . MCH 30.0  27.4 - 33.0 (pg)   . MCHC 34.0  31.6 - 35.5 (g/dL)   . RDW 11.9  10.2 - 14.0 (%)   . PLATELET COUNT 193  140 - 450 (THOU/uL)   . MPV 8.2  7.4 - 10.4 (FL)   . PMN'S 62  40 - 75 (%)   . PMN ABS 5.020  1.5 - 7.7 (THOU/uL)   . LYMPHOCYTES 28  20 - 45 (%)   . LYMPHS ABS 2.210  1.0 - 4.8 (THOU/uL)   . MONOCYTES 7  4 - 13 (%)   . MONOS ABS 0.562  0.1 - 0.9 (THOU/uL)   . EOSINOPHIL 2  1 - 6 (%)   .  EOS ABS 0.153  0.1 - 0.3 (THOU/uL)   . BASOPHILS 1  0 - 1 (%)   . BASOS ABS 0.050  0.0 - 0.2 (THOU/uL)   . NRBC'S 0  0 (/100WBC)   BASIC METABOLIC PANEL, NON-FASTING    Collection Time    11/22/09 11:15 AM   Component Value Range   . SODIUM 138  136 - 145 (mmol/L)   . POTASSIUM 4.0  3.5 - 5.1 (mmol/L)   . CHLORIDE 107  96 - 111 (mmol/L)   . CARBON DIOXIDE 24  23 - 33 (mmol/L)   . ANION GAP 7  5 - 16 (mmol/L)   . CREATININE 1.08  0.62 - 1.27 (mg/dL)   . ESTIMATED GLOMERULAR FILTRATION RATE >59  >59 (ml/min/1.71m2)   . GLUCOSE,NONFAST 120  65 - 139 (mg/dL)   . BUN 9  8 - 26 (mg/dL)   . BUN/CREAT RATIO 8  6 - 22    . CALCIUM 9.1  8.5 - 10.4 (mg/dL)   HEPATIC FUNCTION PANEL    Collection Time    11/22/09 11:15 AM   Component Value Range   . ALBUMIN 3.9  3.5 - 4.8 (g/dL)   . BILIRUBIN, TOTAL 1.2  0.3 - 1.3 (mg/dL)   . ALKALINE PHOSPHATASE 54  38 - 126 (U/L)   . AST (SGOT) 17  8 - 48 (U/L)   . ALT (SGPT) 21  7 - 55 (U/L)   . BILIRUBIN,CONJUGATED 0.2  0.0 - 0.3 (mg/dl)   . TOTAL PROTEIN 6.5  6.4 - 8.3 (g/dL)         Assessment/Plan:  24 yo M with hx of seizures presents to EMU for spell characterization.   -continue Video EEG until discharge  -continue seizure precautions and neuro checks   -baseline labs reviewed and WNL    Hx of Apical scarring/Asthma:  stable  DVT/PE Prophylaxis: Not indicated, patient ambulatory    [name] Freestone, MD 11/23/2009, 8:55 AM       I saw and examined the patient.  I reviewed the resident's note.  I agree with the findings and plan of care as documented in the resident's note.  Any exceptions/additions are edited/noted.  EEG normal no spikes. No spells recorded. Mom says that she does not understand why he was told he has seizures.  D/c home today. Resume all previous meds.  F/u in clinic in 8 weeks.    [name] Body, MD 11/23/2009, 9:14 PM

## 2009-11-23 NOTE — Nurses Notes (Addendum)
1615: Dr Chales Salmon at bedside discussing EEG results. Patient discharged to home accompanied by parent. Discussed discharge summary and follow up appt. Patient and mom both verbalized understanding. 20 gauge right PIV removed without difficulty & catheter intact. Site dressed with 2X2 gauze and tape; instructed patient to remove dressing when he gets home. EMU tech, Susie, at bedside removing EEG turban at this time.    1645: Patient walked off unit accompanied by mother at this time, declined wheelchair escort.

## 2009-11-23 NOTE — Care Management Notes (Signed)
Care Coordinator/Social Work Plan    Patient Name: [name] [name]  DOB: [birth year]  Age: 24  Account Number: [id number]  MR Number: [id number]    Assessment Information    100. Adult Admission Assessment  Created By Alric Seton Date/Time 2009-11-23 15:51:53.283    Patient Assessment & Education    Patient Assessment    After clinical review the patient has no identified discharge needs    Level of Care    Met with pt/family/other and provided education on INPT patient class    CCC/MSW met with pt/family/other and provided education on    On role of the CM Dept in their hospital stay  On role of the Clinical Care Coordinator  How to contact Care Management Department, [facility] and MSW.    CCC/MSW reviewed with patient/family/other    Patient/family questions answered.   Guardianship:    Guardianship Required:    No    Advanced Directive Completed:    No  Communications:    Primary Language:    English    In what languge does the patient/caregiver want the educational material documen  ted:    English  Living Arrangments    Prior to admission housing arrangements:    Alone    Steps:    Number of steps to enter 1  Support System:    Current Medical Equipment in the Home:    None  Agencies, Pharmacy, Dialysis and PCP Active Prior to Admission:    Does the patient have prescription coverage?    No-     Patient's Pharmacy    Name: [facility]  Additional Information Adult:    Literature Provided to Patient:    None  Case Discussed with:    Was the Patient and/or caregiver(s) able to verbalize understanding of the disch  arge plan?    Yes

## 2009-11-24 NOTE — Discharge Summary (Addendum)
[facility]                                              DISCHARGE SUMMARY      PATIENT NAME:  [name] [name],[name] [name]   MRN:  [id number]  DOB:  November 23, 1985    ADMISSION DATE:  11/22/2009  DISCHARGE DATE:  11/23/2009    ATTENDING PHYSICIAN: Dr. Chales Salmon  PRIMARY CARE PHYSICIAN: [name], PA-C     ADMISSION DIAGNOSIS:  spells  DISCHARGE DIAGNOSIS:  spells      Chronic  Problems    Seizures         Date Noted: 01/10/2009      Frontal sinusitis         Date Noted: 01/10/2009                                                                                         DISCHARGE MEDICATIONS:  There are no discharge medications for this patient.    DISCHARGE INSTRUCTIONS:     DISCHARGE INSTRUCTION - MISC   Patient to keep appointment with Dr. Scarlette Ar in the Neurology clinic in October 2011.           REASON FOR HOSPITALIZATION AND HOSPITAL COURSE:  This is a 24 y.o., male who presented to the Epilepsy Monitoring Unit for spell characterization. Patient had a witnessed seizure on Easter weekend. Episodes described as stiffening and shaking that lasted few minutes each time. Patient had a previous admission for spells during which a EEG and MRI brain both were found to be normal. Patient continued to have spells and was started on AED therapy but upon admission was not taking anything.  Patient admitted and put on video EEG.  No spells were recorded and EEG was normal.  The next day patient requested to be discharged home.  Patient discharged home and instructed to keep follow-up appointment in the Neurology clinic.  He was advised not to drive, to operate machinery, to bathe alone and to keep other seizure precautions in mind.    Neurologic exam at discharge:  General: appears in good health  Orientation: Alert and oriented x 3  Memory: Registration, Recall, and Following of commands is normal  Attention: Attention and Concentration are normal  Knowledge: Good  Language: Normal   Speech: Normal  Cranial nerves: Cranial nerves 2-12 are normal  Gait: Normal  Coordination: Coordination is normal without tremor  Sensory: Sensory exam in the upper and lower extremities is normal  Muscle tone: Upper and lower muscle tone is normal  Motor strength:  Motor strength is normal throughout.  Reflexes: Reflexes are 2/2 throughout    DNR Status:  Prior    CONDITION ON DISCHARGE:  A. Ambulation: full  B. Self-care Ability: full  C. Cognitive Status wnl    DISCHARGE DISPOSITION:  Home discharge     cc: Primary Care Physician:  [name]  [address]  [place] [address]     XB:[id number] Physician:  No referring provider defined for this encounter.  [name], MD 11/24/2009, 11:26 AM

## 2009-12-05 NOTE — Procedures (Deleted)
[facility]   ELECTROENCEPHALOGRAM REPORT   EEG\EMG Scheduling [phone]   STATUS: I      NAMEBRADDOCK, SERVELLON   NUUV#:[id number]  DATE: 11/22/2009  DOB : 1985/12/28  SEX:M        EEG#: [id number], day 1.     REQUESTING PHYSICIAN: Carroll Sage MD.    HISTORY: The patient is a 24 year old with a history of passing out spells. The study was requested to evaluate for interictal abnormalities. No antiepileptics are listed.    REPORT: This is a digitally-acquired adult EEG done over a period of 24 hours with video EEG monitoring. The best background seen in this record was a sustained 10 Hz activity symmetric bilaterally and was without any stimulation. Sleep was manifested by recording symmetrical spindles and vertexes in both frontocentral head regions. No pushbutton events were seen and no interictal abnormalities were identified. Digital analysis for *** detection was performed. I recorded symmetrical spindles and K-complexes representing normal sleep architecture.    INTERPRETATION: This is a normal awake and asleep adult EEG for age over a period of 24 hours.      Carroll Sage, MD  Associate Professor  [facility] Department of Neurology    [id number]; D: 12/05/2009 15:39:37; T: 12/05/2009 18:55:58

## 2009-12-06 NOTE — Procedures (Signed)
[facility]                                ELECTROENCEPHALOGRAM REPORT                                EEG\EMG Scheduling [phone]                                 STATUS: I      NAMEGIORDANO, GETMAN   NGEX#:[id number]  DATE: 11/22/2009  DOB :  12/20/1985  SEX:M                  EEG#:  [id number], day 1.     REQUESTING PHYSICIAN:  Carroll Sage MD.    HISTORY:  The patient is a 24 year old with a history of passing out spells.  The study was requested to evaluate for interictal abnormalities.  No antiepileptics are listed.    REPORT:  This is a digitally-acquired adult EEG done over a period of 24 hours with video EEG monitoring.  The best background seen in this record was a sustained 10 Hz activity symmetric bilaterally and was without any stimulation.  Sleep was manifested by recording symmetrical spindles and vertexes in both frontocentral head regions.  No pushbutton events were seen and no interictal abnormalities were identified.  Digital analysis for epileptiform detection was performed.  I recorded symmetrical spindles and K-complexes representing normal sleep architecture.    INTERPRETATION:  This is a normal awake and asleep adult EEG for age over a period of 24 hours.      Carroll Sage, MD  Associate Professor  [facility] Department of Neurology    [id number]; D: 12/05/2009 15:39:37; T: 12/05/2009 18:55:58

## 2009-12-06 NOTE — Procedures (Signed)
[facility]   ELECTROENCEPHALOGRAM REPORT   EEG\EMG Scheduling [phone]   STATUS: I      NAMELOMAX, POEHLER   FAOZ#:[id number]  DATE: 11/23/2009  DOB : 04-03-1986  SEX:M        EEG#: [id number], day 2.    REFERRING PHYSICIAN: Carroll Sage MD.    HISTORY: This is day 2 of video EEG monitoring in a patient with spells concerning for seizures. No antiepileptics are listed.    REPORT: This is the last day of video EEG monitoring done over a period of 4 hours following the standard 10-20 system of electrode placement. The best background seen in this record was a well-sustained 10 Hz activity, symmetric bilaterally and responsive to eye opening and stimulation. Sleep was manifested by recording symmetrical spindles and vertex waves in both frontocentral head regions. No interictal abnormalities were identified and no pushbutton events were seen. Digital analysis for epileptiform spike detection was performed and recorded symmetrical spindles and K-complexes representing normal architecture.    INTERPRETATION: This is a normal 4-hour video EEG monitoring with no events recorded and no interictal abnormalities identified.      Carroll Sage, MD  Associate Professor  [facility] Department of Neurology    [id number]; D: 12/05/2009 15:40:58; T: 12/05/2009 18:59:52    cc: Dion Body MD   Shirleen Schirmer

## 2010-01-10 ENCOUNTER — Encounter (INDEPENDENT_AMBULATORY_CARE_PROVIDER_SITE_OTHER): Payer: 59 | Admitting: Neurology

## 2010-12-13 ENCOUNTER — Ambulatory Visit (INDEPENDENT_AMBULATORY_CARE_PROVIDER_SITE_OTHER): Payer: Self-pay | Admitting: Neurology

## 2010-12-13 NOTE — Telephone Encounter (Signed)
Please advise 

## 2010-12-13 NOTE — Telephone Encounter (Signed)
Message copied by Lamont Dowdy on Wed Dec 13, 2010  8:23 AM  ------       Message from: Omar Person       Created: Tue Dec 12, 2010  5:04 PM         >> STEPHANIE TIDD 12/12/2010 05:04 PM       [facility] PT            Patient called stating he has not seen Dr Scarlette Ar since 10-11-09. He states he is wanting to join the [employer] but they are needing a letter from his Neurologist regarding his Seizures. He was not sure if Dr Scarlette Ar would need to see him again or if he could just call and speak with him over the phone. Iosefa can be reached at [phone]. Thanks

## 2010-12-15 NOTE — Telephone Encounter (Signed)
Can you have him see me in the clinic since it has been too long since I last saw him, next available is fine.    Thanks    Uvaldo Bristle, MD  PGY - 4 Department of Neurology  [facility]

## 2010-12-15 NOTE — Telephone Encounter (Signed)
Appt made for 9/18  Called pt and lmom

## 2010-12-26 ENCOUNTER — Encounter (INDEPENDENT_AMBULATORY_CARE_PROVIDER_SITE_OTHER): Payer: Self-pay | Admitting: Neurology

## 2010-12-26 ENCOUNTER — Ambulatory Visit: Payer: Self-pay | Attending: Neurology | Admitting: Neurology

## 2010-12-26 VITALS — BP 140/80 | HR 90 | Ht 71.0 in | Wt 207.0 lb

## 2010-12-26 DIAGNOSIS — R569 Unspecified convulsions: Secondary | ICD-10-CM | POA: Insufficient documentation

## 2010-12-26 NOTE — Progress Notes (Addendum)
[facility]  [place] Department of Neurology                                       Operated by [facility]  Return Outpatient Note      Date:  12/26/2010  Patient Name: [name] [name]  MRN: [id number]  Age:  25 y.o.  Referring Physician:   Self, Referral  No address on file    CC: seizure-like activity    History obtained from:  Patient and sister    HPI:   25 year old male who presents as follow up to [facility] Neurology clinic for spells of seizure-like activity.  He was admitted in October 2010 with 3 episodes of stiffening and shaking lasting up to 10 minutes with a post-ictal state prior to admission.  Spells were preceded by deja vu.  Patient has tried Depakote and Dilantin in the past but could not tolerate due to feeling tired and development of rash respectively.  He was put on Phenobarbital which he could not tolerate also.  He has had normal routine EEG in June 2011 and a 26 hour EMU stay in August 2011 which was also normal.  During his EMU stay, he did not have any spells.  He is not on any AED and has been off them for over a year and he tells me he has had no spells for 1 year and 4 months.  He says the spells occur more frequently when he is stressed he has had financial stresses in the past.  He is requesting a letter to give to the [employer] about these spells.         PHYSICAL EXAM:    BP 140/80   Pulse 90   Ht 1.803 m (5\' 11" )   Wt 93.895 kg (207 lb)   BMI 28.87 kg/m2    Appearance: No Acute Distress  Ophthalmoscopic: Disc Flat, Normal fundus  Carotid/Heart/Peripheral Vascular: No Bruits, RRR  Mental status:  Orientation: Awake, Alert, and Oriented x 3  Memory: Registation 3/3 Recall 3/3  Attention: Normal  [name]: Appropriate  Language: No aphasia  Speech: No dysarthria  Cranial Nerves:  2 No Visual Defect on Confrontation, Pupils round, equal, reactive to light  3,4,6 Extraocular Movements Intact, no nystagmus  5 Facial Sensation Intact  7 No facial asymmetry   8 Intact hearing  9,10 Palate symmetric, normal gag  11 Good shoulder shrug  12 Tongue Midline  Gait: Stable, no ataxia  Coordination: No ataxia with finger to nose testing, and heel to shin  Sensory: Intact, Symmetric to pinprick, light touch, vibration, and joint position  Muscle Tone: Normal              Muscle exam:  Arm Right Left Leg Right Left   Deltoid 5/5 5/5 Iliopsoas 5/5 5/5   Biceps 5/5 5/5 Quads 5/5 5/5   Triceps 5/5 5/5 Hamstrings 5/5 5/5   Wrist Extension 5/5 5/5 Ankle Dorsi Flexion 5/5 5/5   Wrist Flexion 5/5 5/5 Ankle Plantar Flexion 5/5 5/5   Interossei 5/5 5/5 Ankle Eversion 5/5 5/5   APB 5/5 5/5 Ankle Inversion 5/5 5/5       Reflexes - symmetric  Personal review of               No visits with results within 3 Month(s) from this visit.  Latest known visit with results is:  Admission on 11/22/2009, Discharged on 11/23/2009   Component Date Value   . WBC 11/22/2009 8.0    . RBC 11/22/2009 4.92    . HGB 11/22/2009 14.8    . HCT 11/22/2009 43.5    . MCV 11/22/2009 88.4    . [facility] 11/22/2009 30.0    . MCHC 11/22/2009 34.0    . RDW 11/22/2009 11.9    . PLATELET COUNT 11/22/2009 193    . MPV 11/22/2009 8.2    . PMN'S 11/22/2009 62    . PMN ABS 11/22/2009 5.020    . LYMPHOCYTES 11/22/2009 28    . LYMPHS ABS 11/22/2009 2.210    . MONOCYTES 11/22/2009 7    . MONOS ABS 11/22/2009 0.562    . EOSINOPHIL 11/22/2009 2    . EOS ABS 11/22/2009 0.153    . BASOPHILS 11/22/2009 1    . BASOS ABS 11/22/2009 0.050    . NRBC'S 11/22/2009 0    . SODIUM 11/22/2009 138    . POTASSIUM 11/22/2009 4.0    . CHLORIDE 11/22/2009 107    . CARBON DIOXIDE 11/22/2009 24    . ANION GAP 11/22/2009 7    . CREATININE 11/22/2009 1.08    . ESTIMATED GLOMERULAR FIL* 11/22/2009 >59    . GLUCOSE,NONFAST 11/22/2009 120    . BUN 11/22/2009 9    . BUN/CREAT RATIO 11/22/2009 8    . CALCIUM 11/22/2009 9.1    . ALBUMIN 11/22/2009 3.9    . BILIRUBIN, TOTAL 11/22/2009 1.2    . ALKALINE PHOSPHATASE 11/22/2009 54    . AST (SGOT) 11/22/2009 17     . ALT (SGPT) 11/22/2009 21    . BILIRUBIN,CONJUGATED 11/22/2009 0.2    . TOTAL PROTEIN 11/22/2009 6.5                       Assessment/Plan:   1. Spells (780.39)      It is difficult to determine if the etiology is epileptic or non-epileptic.  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  He has not had these spells for over a year despite not being on any anti epileptic drugs.  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  If these spells become more frequent, he may need to be re-admitted to the epilepsy monitoring unit to characterize these spells.         Uvaldo Bristle, MD 12/26/2010, 5:43 PM  Uvaldo Bristle, MD  PGY - 4 Department of Neurology  [facility]    I saw and examined the patient.  I reviewed the resident's note.  I agree with the findings and plan of care as documented in the resident's note.  Any exceptions/additions are edited/noted.    Maureen Chatters, MD 12/26/2010, 7:07 PM

## 2014-06-17 ENCOUNTER — Encounter (FREE_STANDING_LABORATORY_FACILITY): Admit: 2014-06-17 | Discharge: 2014-06-17 | Disposition: A | Discharge status: Home / Self Care | Payer: Self-pay

## 2014-06-18 LAB — OVA AND PARASITE SCREEN

## 2014-06-20 LAB — ROUTINE STOOL CULTURE (INCLUDING E. COLI SHIGA TOXIN): E COLI SHIGA TOXIN: NEGATIVE

## 2016-08-10 DIAGNOSIS — M7552 Bursitis of left shoulder: Secondary | ICD-10-CM

## 2016-08-10 DIAGNOSIS — M7711 Lateral epicondylitis, right elbow: Secondary | ICD-10-CM

## 2016-08-27 ENCOUNTER — Ambulatory Visit (INDEPENDENT_AMBULATORY_CARE_PROVIDER_SITE_OTHER): Payer: Self-pay | Admitting: NURSE PRACTITIONER

## 2016-08-27 DIAGNOSIS — M7552 Bursitis of left shoulder: Secondary | ICD-10-CM

## 2016-08-27 DIAGNOSIS — M7711 Lateral epicondylitis, right elbow: Secondary | ICD-10-CM

## 2016-08-27 NOTE — Telephone Encounter (Signed)
-----   Message from AvonLora Ashby, [place] sent at 08/27/2016 10:12 AM EDT -----  Regarding: FW: Order for PT   ----- Message from Marlana LatusSandy L Friend sent at 08/27/2016 10:08 AM EDT -----  [facility] called, Patient came to PT last week and now they need an order from us for it from whom ever he saw .Marland Kitchen. If that could be faxed to them [phone]

## 2018-10-01 ENCOUNTER — Ambulatory Visit (INDEPENDENT_AMBULATORY_CARE_PROVIDER_SITE_OTHER): Payer: Self-pay | Admitting: OTOLARYNGOLOGY

## 2019-01-27 ENCOUNTER — Other Ambulatory Visit: Payer: Self-pay | Admitting: Registered"

## 2019-01-27 DIAGNOSIS — Z20822 Contact with and (suspected) exposure to covid-19: Secondary | ICD-10-CM

## 2019-01-28 LAB — NOVEL CORONAVIRUS, NAA: SARS-CoV-2, NAA: DETECTED — AB

## 2019-05-25 ENCOUNTER — Ambulatory Visit: Payer: BC Managed Care – PPO | Admitting: Cardiovascular Disease

## 2019-05-25 ENCOUNTER — Other Ambulatory Visit: Payer: Self-pay

## 2019-05-25 ENCOUNTER — Encounter: Payer: Self-pay | Admitting: Cardiovascular Disease

## 2019-05-25 VITALS — BP 120/77 | HR 66 | Ht 69.0 in | Wt 165.2 lb

## 2019-05-25 DIAGNOSIS — R0600 Dyspnea, unspecified: Secondary | ICD-10-CM | POA: Diagnosis not present

## 2019-05-25 DIAGNOSIS — R0609 Other forms of dyspnea: Secondary | ICD-10-CM

## 2019-05-25 DIAGNOSIS — R079 Chest pain, unspecified: Secondary | ICD-10-CM | POA: Diagnosis not present

## 2019-05-25 DIAGNOSIS — U071 COVID-19: Secondary | ICD-10-CM

## 2019-05-25 NOTE — Patient Instructions (Signed)
Medication Instructions:  CONTINUE WITH CURRENT MEDICATIONS. NO CHANGES. *If you need a refill on your cardiac medications before your next appointment, please call your pharmacy*   Testing/Procedures: Your physician has requested that you have an echocardiogram. Echocardiography is a painless test that uses sound waves to create images of your heart. It provides your doctor with information about the size and shape of your heart and how well your heart's chambers and valves are working. This procedure takes approximately one hour. There are no restrictions for this procedure.  [address]  A chest x-ray takes a picture of the organs and structures inside the chest, including the heart, lungs, and blood vessels. This test can show several things, including, whether the heart is enlarges; whether fluid is building up in the lungs; and whether pacemaker / defibrillator leads are still in place. DONE AT [facility]  Follow-Up: PRN FOLLOWING TEST RESULTS

## 2019-05-25 NOTE — Progress Notes (Signed)
Cardiology Office Note    Date:  05/27/2019   ID:  Charleston Ropes, DOB 04-24-1985, MRN [id number]  PCP:  Asencion Gowda.August Saucer, MD  Cardiologist:  Nicki Guadalajara, MD   Cardiology evaluation referred through the courtesy of Dr. Lupe Carney chest pressure following recent Covid infection.  History of Present Illness:  Thomas Alvarado is a 34 y.o. male who is referred to the courtesy of Dr. Lupe Carney after experiencing chest tightness and shortness of breath since a Covid infection 3 months previously.  Shaughn Thomley denies any known history of cardiac disease.  He was fairly active and played baseball in college.  While playing baseball he chewed tobacco but never smoked cigarettes.  He no longer uses tobacco products. Three months ago he tested positive for Covid and his symptoms included significant postnasal drip and sore throat but without definitive fever.  He subsequent developed chest pressure shortness of breath and 1 week later he was awakened with significant shortness of breath and chest pressure which persisted for 3 hours ultimately resolved.   He was not certain if he was having a panic attack or potentially having heart pain.  He never sought medical attention.  Ultimately his symptoms subsided.  Since that time, he has had recurrent episodes of chest pressure.  At times he feels that he cannot get up full deep breath.  He recently underwent a wellness evaluation with Dr. Lupe Carney.  Due to the patient's concerns of chest pressure he is now referred for cardiology evaluation.   Patient states that since Covid he has not been doing much exercise.  He has noticed that in lifting his child above his head he does experience some chest pain.  Medical history is notable for he has chronic sinus problems and allergic rhinitis and typically takes Flonase, singulair and either Claritin or Zyrtec.  Surgical history is notable for left ankle surgery and wisdom teeth  extraction  Current Medications: Outpatient Medications Prior to Visit  Medication Sig Dispense Refill  . fluticasone (FLONASE) 50 MCG/ACT nasal spray Place 1 spray into the nose as directed.    . montelukast (SINGULAIR) 10 MG tablet Take 1 tablet by mouth daily.     No facility-administered medications prior to visit.     Allergies:   Cefaclor   Social History   Socioeconomic History  . Marital status: Married    Spouse name: Not on file  . Number of children: Not on file  . Years of education: Not on file  . Highest education level: Not on file  Occupational History  . Not on file  Tobacco Use  . Smoking status: Never Smoker  . Smokeless tobacco: Never Used  Substance and Sexual Activity  . Alcohol use: Not on file  . Drug use: Not on file  . Sexual activity: Not on file  Other Topics Concern  . Not on file  Social History Narrative  . Not on file   Social Determinants of Health   Financial Resource Strain:   . Difficulty of Paying Living Expenses: Not on file  Food Insecurity:   . Worried About Programme researcher, broadcasting/film/video in the Last Year: Not on file  . Ran Out of Food in the Last Year: Not on file  Transportation Needs:   . Lack of Transportation (Medical): Not on file  . Lack of Transportation (Non-Medical): Not on file  Physical Activity:   . Days of Exercise per Week: Not on file  . Minutes of  Exercise per Session: Not on file  Stress:   . Feeling of Stress : Not on file  Social Connections:   . Frequency of Communication with Friends and Family: Not on file  . Frequency of Social Gatherings with Friends and Family: Not on file  . Attends Religious Services: Not on file  . Active Member of Clubs or Organizations: Not on file  . Attends Archivist Meetings: Not on file  . Marital Status: Not on file    Socially he was born in [place].  He has a BS degree.  He played baseball in college.  [employer] for [employer].  He is married and has 1  child age 66  Family History: Family history is notable in that his mother is age 39 and father age 43.  Both are smokers.  Maternal grandmother has hypertension.  He has 1 brother age 667.  ROS General: Negative; No fevers, chills, or night sweats;  HEENT: Negative; No changes in vision or hearing, sinus congestion, difficulty swallowing Pulmonary: Negative; No cough, wheezing, shortness of breath, hemoptysis Cardiovascular: Negative; No chest pain, presyncope, syncope, palpitations GI: Negative; No nausea, vomiting, diarrhea, or abdominal pain GU: Negative; No dysuria, hematuria, or difficulty voiding Musculoskeletal: Negative; no myalgias, joint pain, or weakness Hematologic/Oncology: Negative; no easy bruising, bleeding Endocrine: Negative; no heat/cold intolerance; no diabetes Neuro: Negative; no changes in balance, headaches Skin: Negative; No rashes or skin lesions Psychiatric: Negative; No behavioral problems, depression Sleep: Negative; No snoring, daytime sleepiness, hypersomnolence, bruxism, restless legs, hypnogognic hallucinations, no cataplexy Other comprehensive 14 point system review is negative.   PHYSICAL EXAM:   VS:  BP 120/77   Pulse 66   Ht 5\' 9"  (1.753 m)   Wt 165 lb 3.2 oz (74.9 kg)   SpO2 98%   BMI 24.40 kg/m     Repeat by me 122/72  Wt Readings from Last 3 Encounters:  05/25/19 165 lb 3.2 oz (74.9 kg)    General: Alert, oriented, no distress.  Skin: normal turgor, no rashes, warm and dry HEENT: Normocephalic, atraumatic. Pupils equal round and reactive to light; sclera anicteric; extraocular muscles intact;  Nose without nasal septal hypertrophy Mouth/Parynx benign; Mallinpatti scale 3 Neck: No JVD, no carotid bruits; normal carotid upstroke Lungs: clear to ausculatation and percussion; no wheezing or rales Chest wall: without tenderness to palpitation Heart: PMI not displaced, RRR, s1 s2 normal, 1/6 systolic murmur, no diastolic murmur, no rubs,  gallops, thrills, or heaves Abdomen: soft, nontender; no hepatosplenomehaly, BS+; abdominal aorta nontender and not dilated by palpation. Back: no CVA tenderness Pulses 2+ Musculoskeletal: full range of motion, normal strength, no joint deformities Extremities: no clubbing cyanosis or edema, Homan's sign negative  Neurologic: grossly nonfocal; Cranial nerves grossly wnl Psychologic: Normal mood and affect   Studies/Labs Reviewed:   EKG:  EKG is ordered today.  ECG (independently read by me): NSR at 66, no ST changes; no ectopy; normal intervals  Recent Labs: No flowsheet data found.   No flowsheet data found.  No flowsheet data found. No results found for: MCV No results found for: TSH No results found for: HGBA1C   BNP No results found for: BNP  ProBNP No results found for: PROBNP   Lipid Panel  No results found for: CHOL, TRIG, HDL, CHOLHDL, VLDL, LDLCALC, LDLDIRECT, LABVLDL   RADIOLOGY: No results found.   Additional studies/ records that were reviewed today include:  I reviewed the records of Dr. Donnie Coffin at [facility].  Laboratory  was reviewed.  Hemoglobin 14.9 hematocrit 44.3.  White blood count 5.7.  ASSESSMENT:    1. Chest pain of uncertain etiology   2. Dyspnea on exertion   3. COVID-19 virus infection     PLAN:  Mr. Arihaan Bellucci is a 34 year old fairly healthy young gentleman who developed Covid infection approximately 3 months ago with out definitive fever but with sore throat, persistent postnasal drip, fatigability, shortness of breath, as well as episodic chest discomfort.  One week after his diagnosis he had a significant episode of chest pressure and shortness of breath which lasted for 3 hours.  Subsequently, he still has experienced episodic left-sided chest discomfort and at times he finds it hard to take a deep breath.  Presently, I am recommending he undergo a PA and lateral chest x-ray to make certain he does not have any significant  scarring.  He was never hospitalized during his infection.  I am also scheduling him to undergo a 2D echo Doppler study post Covid to make certain there is no inflammatory sequelae leading to potential myocarditis.  O2 saturation is 98%.  He is unaware of any clotting abnormalities symptoms concerning for PE.  His ECG is unremarkable and shows sinus rhythm at 66 without ectopy or ST segment abnormalities and with normal intervals.  With his significant surgical rhinitis history, he may benefit from seeing an allergist.  His chest pain does not sound ischemic.  I will contact him regarding the above studies and further recommendations will be made at that time.   Medication Adjustments/Labs and Tests Ordered: Current medicines are reviewed at length with the patient today.  Concerns regarding medicines are outlined above.  Medication changes, Labs and Tests ordered today are listed in the Patient Instructions below. Patient Instructions  Medication Instructions:  CONTINUE WITH CURRENT MEDICATIONS. NO CHANGES. *If you need a refill on your cardiac medications before your next appointment, please call your pharmacy*   Testing/Procedures: Your physician has requested that you have an echocardiogram. Echocardiography is a painless test that uses sound waves to create images of your heart. It provides your doctor with information about the size and shape of your heart and how well your heart's chambers and valves are working. This procedure takes approximately one hour. There are no restrictions for this procedure.  [address]  A chest x-ray takes a picture of the organs and structures inside the chest, including the heart, lungs, and blood vessels. This test can show several things, including, whether the heart is enlarges; whether fluid is building up in the lungs; and whether pacemaker / defibrillator leads are still in place. DONE AT [facility]  Follow-Up: PRN FOLLOWING TEST  RESULTS     Signed, Nicki Guadalajara, MD  05/27/2019 6:07 PM    [facility] [address], [place], [place]  [address] Phone: [phone]

## 2019-05-27 ENCOUNTER — Encounter: Payer: Self-pay | Admitting: Cardiovascular Disease

## 2019-05-29 ENCOUNTER — Ambulatory Visit
Admission: RE | Admit: 2019-05-29 | Discharge: 2019-05-29 | Disposition: A | Discharge status: Home / Self Care | Payer: BC Managed Care – PPO | Source: Ambulatory Visit | Attending: Cardiovascular Disease | Admitting: Cardiovascular Disease

## 2019-05-29 DIAGNOSIS — R0609 Other forms of dyspnea: Secondary | ICD-10-CM

## 2019-05-29 DIAGNOSIS — R079 Chest pain, unspecified: Secondary | ICD-10-CM

## 2019-06-08 ENCOUNTER — Other Ambulatory Visit: Payer: Self-pay

## 2019-06-08 ENCOUNTER — Ambulatory Visit (HOSPITAL_COMMUNITY): Payer: BC Managed Care – PPO | Attending: Cardiology

## 2019-06-08 DIAGNOSIS — R079 Chest pain, unspecified: Secondary | ICD-10-CM | POA: Diagnosis not present

## 2019-06-08 DIAGNOSIS — R0600 Dyspnea, unspecified: Secondary | ICD-10-CM

## 2019-06-08 DIAGNOSIS — R0609 Other forms of dyspnea: Secondary | ICD-10-CM

## 2020-02-08 ENCOUNTER — Other Ambulatory Visit: Payer: Self-pay

## 2020-02-08 ENCOUNTER — Encounter (FREE_STANDING_LABORATORY_FACILITY)
Admit: 2020-02-08 | Discharge: 2020-02-08 | Disposition: A | Discharge status: Home / Self Care | Payer: 59 | Attending: Physician Assistant | Admitting: Physician Assistant

## 2020-02-08 ENCOUNTER — Encounter (FREE_STANDING_LABORATORY_FACILITY): Payer: 59 | Admitting: Physician Assistant

## 2020-02-08 ENCOUNTER — Encounter (INDEPENDENT_AMBULATORY_CARE_PROVIDER_SITE_OTHER): Payer: Self-pay | Admitting: Physician Assistant

## 2020-02-08 ENCOUNTER — Ambulatory Visit (INDEPENDENT_AMBULATORY_CARE_PROVIDER_SITE_OTHER): Payer: 59 | Admitting: Physician Assistant

## 2020-02-08 VITALS — BP 130/88 | HR 73 | Temp 97.9°F | Ht 71.0 in | Wt 250.0 lb

## 2020-02-08 DIAGNOSIS — J029 Acute pharyngitis, unspecified: Secondary | ICD-10-CM | POA: Insufficient documentation

## 2020-02-08 DIAGNOSIS — Z20822 Contact with and (suspected) exposure to covid-19: Secondary | ICD-10-CM

## 2020-02-08 DIAGNOSIS — F17210 Nicotine dependence, cigarettes, uncomplicated: Secondary | ICD-10-CM

## 2020-02-08 DIAGNOSIS — R5383 Other fatigue: Secondary | ICD-10-CM

## 2020-02-08 DIAGNOSIS — R509 Fever, unspecified: Secondary | ICD-10-CM

## 2020-02-08 DIAGNOSIS — R059 Cough, unspecified: Secondary | ICD-10-CM

## 2020-02-08 DIAGNOSIS — J0100 Acute maxillary sinusitis, unspecified: Secondary | ICD-10-CM

## 2020-02-08 MED ORDER — FLUTICASONE PROPIONATE 50 MCG/ACTUATION NASAL SPRAY,SUSPENSION
NASAL | 0 refills | Status: DC
Start: 2020-02-08 — End: 2020-08-25

## 2020-02-08 MED ORDER — AMOXICILLIN 875 MG-POTASSIUM CLAVULANATE 125 MG TABLET
1.00 | ORAL_TABLET | Freq: Two times a day (BID) | ORAL | 0 refills | Status: AC
Start: 2020-02-08 — End: 2020-02-18

## 2020-02-08 NOTE — Nursing Note (Signed)
Pt presents with sore throat, dry cough (with occasional phlegm)  fever, and fatigue,   Symptoms started yesterday.  Pt states spouse was diagnosed with covid on 01-26-2020, children tested positive for covid on Tuesday.  Pt has been not tried any OTC medication.      Nasal Specimen was collected, labeled and packaged in lab appropriate container for transport to [facility]  Arvilla Market, MA 02/08/2020 11:34

## 2020-02-08 NOTE — Patient Instructions (Signed)
Coronavirus Disease 2019 (COVID-19): Caring for Yourself or Others   If you or a household member have symptoms of COVID-19, follow these guidelines for preventing spread of the virus and managing symptoms. This is regardless of your vaccination status.   If you have COVID-19 symptoms   Stay home. Call your healthcare provider and tell them you have symptoms of COVID-19. Do this before going to any hospital or clinic. Follow your provider's instructions. You may be advised to isolate yourself at home. This is called self-isolation. You may also be told to stay at least 6 feet from others to prevent the spread of COVID-19. This is called "social distancing."   Stay away from work, school, and public places. Limit physical contact with family members. Limit visitors. Don't kiss anyone or share eating or drinking utensils. Clean surfaces you touch with disinfectant. This is to help prevent the virus from spreading.   If you need to cough or sneeze, do it into a tissue. Then throw the tissue into the trash. If you don't have tissues, cough or sneeze into the bend of your elbow.   Wear a cloth face mask with two or more layers of washable, breathable fabric while in public or when indoors with people who don't live with you. Or you can wear a disposable paper mask with a cloth mask on top. You can make a cloth face mask of your own. The CDC has instructions on how to make a face mask. Wear the mask so that it covers both your nose and mouth.   Don't share food or personal items with people in your household. This includes items like eating and drinking utensils, towels, and bedding.   If you need to go to a hospital or clinic, expect that the healthcare staff will wear protective equipment such as masks, gowns, gloves, and eye protection. You may be advised to wait in or enter through a separate area. This is to prevent the possible virus from spreading.   Tell the healthcare staff about recent travel. This  includes local travel on public transport. Staff may need to find other people you have been in contact with.   Follow all instructions the healthcare staff give you.    If you have been diagnosed with COVID-19   Stay home and start self-isolation. Don't leave your home unless you need to get medical care. Don't go to work, school, or public areas. Don't use public transportation or taxis.   Follow all instructions from your healthcare provider. Call your healthcare provider's office before going. They can prepare and give you instructions. This will help prevent the virus from spreading.   If you need to go to a hospital or clinic, expect that the healthcare staff will wear protective equipment such as masks, gowns, gloves, and eye protection. You may be advised to wait in or enter through a separate area. This is to prevent the possible virus from spreading.   Wear a face mask with 2 or more layers. Use either a cloth mask with layers of tightly woven, breathable fabric or a disposable paper mask with a cloth mask on top. This is to protect other people from your germs. If you are not able to wear a mask, your caregivers should. You can make a cloth face mask of your own. The CDC has instructions on how to make a face mask. Wear the mask so that it covers both your nose and mouth.   Stay away from other people   in your home.   Avoid contact with pets and animals.   Don't share food or personal items with people in your household. This includes items like eating and drinking utensils, towels, and bedding.   If you need to cough or sneeze, do it into a tissue. Then throw the tissue into the trash. If you don't have tissues, cough or sneeze into the bend of your elbow.   Wash your hands often.    Self-care at home  The FDA has approved several vaccines to prevent COVID-19. One vaccine has been approved for people as young as 12. Talk with your healthcare provider about your risks and which vaccine is best  for you and your family.   Pregnant or breastfeeding people may choose to be vaccinated. Expert groups, including ACOG and the CDC, advise pregnant or breastfeeding people to talk with their healthcare provider about being vaccinated.   The vaccines are being rolled out to the public in phases. Check your local health department about your community's roll-out plans. The vaccines are given as a shot (injection) in the arm muscle. A 1-dose or 2-dose vaccine may be given. If you get the 2-dose vaccine, the second dose is given several weeks after the first.   Current treatment is mainly aimed at helping your body while it fights the virus. This is known as supportive care. For serious COVID-19, you may need to stay in the hospital. Supportive care includes:    Getting rest. This helps your body fight the illness.   Staying hydrated.  Drinking liquids is the best way to prevent dehydration. Try to drink 6 to 8 glasses of liquids every day, or as advised by your provider. Also check with your provider about which fluids are best for you. Don't drink fluids that contain caffeine or alcohol.   Taking over-the-counter (OTC) pain medicine. These are used to help ease pain and reduce fever. Follow your healthcare provider's instructions for which OTC medicine to use.  If you've been treated for suspected or confirmed COVID-19 , follow all of your healthcare team's instructions. This will include when it's OK to stop self-isolation. You may also get instructions on position changes to help your breathing, such as lying on your belly (prone positioning). If you were treated at a hospital and discharged, you may be sent home with a pulse oximeter. This is a small electronic device that you clip on your fingertip. It measures the amount of oxygen in your body. Follow your healthcare team's instructions on its use, how they will be in touch with you, and when to call them.   The FDA recently approved monoclonal antibody  therapy for emergency use in certain people who have a positive COVID-19 viral test and have mild to moderate symptoms but are not in the hospital. It's not widely available and is still being investigated. It's approved for people 12 years and older who weigh about 88 pounds (40 kgs) and are at high risk for severe COVID-19 and a hospital stay. This includes people who are 65 years and older and people with certain chronic conditions. Monoclonal antibody therapy is not approved for people who:    Are in the hospital with COVID-19, or   Need oxygen therapy for COVID-19, or   Need oxygen therapy for a chronic condition and need to have oxygen flow increased because of COVID-19  If you've had confirmed COVID-19, your healthcare team may ask you to consider donating your plasma. This is called COVID-19   convalescent plasma donation. Plasma from people fully recovered from COVID-19 may contain antibodies to help fight COVID-19 in people who are currently seriously ill with the disease. Experts don't know the safety of COVID-19 convalescent plasma or how well it works. Research continues. The FDA has approved it for emergency use in certain people with serious or life-threatening COVID-19.   Home care for a sick person   Follow all instructions from healthcare staff.   Wash your hands often.   Wear protective clothing as advised.   Make sure the sick person wears a mask. If they can't wear a mask, don't stay in the same room with the person. If you must be in the same room, wear a face mask. When wearing a mask, make sure that it covers both the nose and mouth.   Keep track of the sick person's symptoms.   Clean home surfaces often with disinfectant. This includes phones, kitchen counters, fridge door handle, bathroom surfaces, and others.   Don't let anyone share household items with the sick person. This includes eating and drinking tools, towels, sheets, or blankets.   Clean fabrics and laundry  thoroughly.   Keep other people and pets away from the sick person.    When you can stop self-isolation  When you are sick with COVID-19, you should stay away from other people. This is called self-isolation.   Your limits are different if you've had COVID-19 in the last 3 months but are fully recovered without symptoms and you have been exposed to someone with COVID-19. If you are symptom-free, you don't need to stay home away from others or be retested. The CDC doesn't recommend retesting unless you have symptoms of COVID-19 and your new symptoms can't be linked to another illness. Contact your healthcare provider if you have any questions. If you develop symptoms, stay home. If you had COVID-19 over 3 months ago and have been exposed again, treat it like you've never had COVID-19 and stay home, limit your contact with others, call your provider, and monitor for symptoms.   If you are normally healthy, the CDC does not advise retesting for COVID-19 with nose-throat swabs. You can stop self-isolation when all 3 of these are true:   1. You have had no fever for at least 24 hours. This means no fever without medicine that reduces fever, such as acetaminophen, for at least 24 hours.  2. Your symptoms such as cough or trouble breathing have improved.  3. It has been at least 10 days since your first symptoms started.  Talk with your healthcare provider before you leave home. Tell them if the 3 things above are true for you. They may tell you it's OK to leave home. In some cases, your state or local area may have specific advice. Your healthcare provider will tell you more.   If you have a weak immune system and COVID-19, or if you've had severe COVID-19,  your instructions on when to stop isolation will be somewhat different. Some conditions and treatments can cause a weak immune system. These include cancer treatment, bone marrow or organ transplants, and conditions such as HIV or other immune system disorders. You  may be advised to stay home from 10 days to 20 days after your symptoms first started. Your healthcare provider may want to retest you for COVID-19. Follow your provider's instructions.   Mask guidance  Consider the CDC's guidance and your local community's instructions on face masks.        Cloth masks may help prevent people who have COVID-19 from spreading the virus to others.   Cloth masks are most likely to reduce COVID-19 spread when masks are widely used by people who are out in the public.   Wear a mask inside your house if you live with someone who has symptoms of COVID-19 or has tested positive for COVID-19.   CDC's guidance for when to wear a mask and socially distance is different for fully vaccinated people. Fully vaccinated means 2 weeks after getting either the 1-dose or the second shot of the 2-dose vaccine. Fully vaccinated people don't need to wear a mask or socially distance in any setting (indoors or out) unless required by local, state, federal, or workplace rules. Follow your community's safety precautions.    Generally, the CDC advises people ages 2 and older who are not vaccinated to wear masks in public places and when around unvaccinated people who don't live in their household. But certain people should not wear a face mask. This includes:    Children younger than 2 years old   Anyone with a health, developmental, or mental health condition that can be made worse by wearing a mask   Anyone who is unconscious or unable to remove the face covering without help. See the CDC's guidance on who should not wear a face mask.    When to call your healthcare provider  Call your healthcare provider right away if a sick person has any of these:    Trouble breathing   Pain or pressure in chest  If a sick person has any of these, call 911:   Trouble breathing that gets worse   Pain or pressure in chest that gets worse   Blue tint to lips or face   Fast or irregular heartbeat   Confusion or  trouble waking   Fainting or loss of consciousness   Coughing up blood  Going home from the hospital   If you were diagnosed with COVID-19 and were recently discharged from the hospital:    Follow the instructions above for self-care and isolation.   Follow the hospital healthcare team's specific instructions.   Ask questions if anything is unclear to you. Write down answers so you remember them.  Date last modified: 08/21/2019  [employer] last reviewed this educational content on 07/09/2018   2000-2021 [employer]. All rights reserved. This information is not intended as a substitute for professional medical care. Always follow your healthcare professional's instructions.

## 2020-02-08 NOTE — Progress Notes (Signed)
[address] [facility] [place]  [address] [place] [address]  [facility]  Progress Note    Name: [name] [name] MRN:  [id number]   Date: 02/08/2020 Age: 34 y.o.         Chief Complaint:    Chief Complaint   Patient presents with   . Cough   . Fever   . Sore Throat   . Exposure To Coronavirus   . Fatigue     BP 130/88   Pulse 73   Temp 36.6 C (97.9 F) (Thermal Scan)   Ht 1.803 m (5\' 11" )   Wt 113 kg (250 lb)   SpO2 97%   BMI 34.87 kg/m         Subjective:  Patient is a 34yo male who is seen in the office for evaluation of sick symptoms. Onset yesterday. Patient complains of fever, fatigue, congestion, sore throat, dry cough which is occasionally productive.  He denies headache, chills, body aches, nausea, vomiting, diarrhea.  Patient has not taken anything for symptom relief. Patient reports wife and children are positive for COVID.  Nothing makes him feel better or worse.       The history is provided by the patient.       Review of Systems   Constitutional: Positive for activity change and fever. Negative for appetite change, chills, diaphoresis and fatigue.   HENT: Positive for congestion, postnasal drip, sinus pressure and sore throat. Negative for ear pain.    Eyes: Negative for pain, discharge, redness and itching.   Respiratory: Positive for cough. Negative for chest tightness, shortness of breath and wheezing.    Cardiovascular: Negative for chest pain and leg swelling.   Gastrointestinal: Negative for nausea and vomiting.   Musculoskeletal: Negative for arthralgias and myalgias.   Skin: Negative for rash.   Allergic/Immunologic: Positive for environmental allergies.   Neurological: Negative for dizziness and headaches.   Psychiatric/Behavioral: Negative for sleep disturbance.   All other systems reviewed and are negative.      Past Medical History  Allergies   Allergen Reactions   . Dilantin [Phenytoin]    . Tussin [Guaifenesin]      Past Medical History:   Diagnosis  Date   . Apical lung scarring    . Asthma    . Seizures      Past Surgical History:   Procedure Laterality Date   . HX FOOT SURGERY       Family Medical History:     Problem Relation (Age of Onset)    Cancer Maternal Grandmother    Diabetes Maternal Grandfather, Paternal Grandmother    Heart Disease Maternal Grandfather    Hypertension (High Blood Pressure) Maternal Grandfather    No Known Problems Mother, Father            Social History     Socioeconomic History   . Marital status: Single     Spouse name: Not on file   . Number of children: Not on file   . Years of education: Not on file   . Highest education level: Not on file   Occupational History     Employer: [employer]   Tobacco Use   . Smoking status: Current Every Day Smoker     Packs/day: 0.50     Years: 6.00     Pack years: 3.00     Types: Cigarettes   . Smokeless tobacco: Never Used   Vaping Use   . Vaping Use:  Never used   Substance and Sexual Activity   . Alcohol use: Yes     Alcohol/week: 2.5 standard drinks     Types: 3 Cans of beer per week   . Drug use: No     Social Determinants of Health     Financial Resource Strain:    . Difficulty of Paying Living Expenses:    Food Insecurity:    . Worried About Programme researcher, broadcasting/film/video in the Last Year:    . Barista in the Last Year:    Transportation Needs:    . Freight forwarder (Medical):    Marland Kitchen Lack of Transportation (Non-Medical):    Physical Activity:    . Days of Exercise per Week:    . Minutes of Exercise per Session:    Stress:    . Feeling of Stress :    Intimate Partner Violence:    . Fear of Current or Ex-Partner:    . Emotionally Abused:    Marland Kitchen Physically Abused:    . Sexually Abused:        Objective:    Physical Exam  Vitals and nursing note reviewed.   Constitutional:       General: He is not in acute distress.     Appearance: Normal appearance. He is not ill-appearing or toxic-appearing.   HENT:      Head: Normocephalic and atraumatic.      Right Ear: Tympanic membrane and  ear canal normal.      Left Ear: Tympanic membrane and ear canal normal.      Nose: Congestion present. No rhinorrhea.      Right Sinus: Maxillary sinus tenderness present. No frontal sinus tenderness.      Left Sinus: Maxillary sinus tenderness present. No frontal sinus tenderness.      Mouth/Throat:      Lips: Pink.      Mouth: Mucous membranes are moist.      Pharynx: Oropharynx is clear. Uvula midline. No oropharyngeal exudate or posterior oropharyngeal erythema.   Eyes:      General: Lids are normal. Vision grossly intact. Gaze aligned appropriately.      Extraocular Movements: Extraocular movements intact.      Conjunctiva/sclera: Conjunctivae normal.      Pupils: Pupils are equal, round, and reactive to light.   Neck:      Trachea: Trachea normal.   Cardiovascular:      Rate and Rhythm: Normal rate and regular rhythm.   Pulmonary:      Effort: Pulmonary effort is normal.      Breath sounds: Normal breath sounds and air entry. No decreased breath sounds, wheezing, rhonchi or rales.   Musculoskeletal:         General: Normal range of motion.      Cervical back: Normal range of motion and neck supple. Normal range of motion.   Lymphadenopathy:      Cervical: No cervical adenopathy.   Skin:     General: Skin is warm and dry.      Findings: No erythema or rash.   Neurological:      General: No focal deficit present.      Mental Status: He is alert and oriented to person, place, and time.   Psychiatric:         Attention and Perception: Attention and perception normal.         Mood and Affect: Mood and affect normal.  Speech: Speech normal.         Behavior: Behavior normal. Behavior is cooperative.         Thought Content: Thought content normal.         Judgment: Judgment normal.         Data Reviewed:    Current Outpatient Medications:   .  amoxicillin-pot clavulanate (AUGMENTIN) 875-125 mg Oral Tablet, Take 1 Tablet by mouth Every 12 hours for 10 days, Disp: 20 Tablet, Rfl: 0  .  fluticasone propionate  (FLONASE) 50 mcg/actuation Nasal Spray, Suspension, 1 squirt in each nostril 1-2 times per day, Disp: 16 g, Rfl: 0      Assessment/Plan:  Orders Placed This Encounter   . COVID-19 [place] [facility]   . CANCELED: POCT Rapid Strep A   . CANCELED: POCT INFLUENZA A & B   . amoxicillin-pot clavulanate (AUGMENTIN) 875-125 mg Oral Tablet   . fluticasone propionate (FLONASE) 50 mcg/actuation Nasal Spray, Suspension         ICD-10-CM    1. Cough  R05.9 COVID-19 [place] [facility]     COVID-19 [place] [facility]   2. Sore throat  J02.9 COVID-19 [place] [facility]     COVID-19 [place] [facility]   3. Fatigue  R53.83 COVID-19 [place] [facility]     COVID-19 [place] [facility]   4. Fever  R50.9 COVID-19 [place] [facility]     COVID-19 [place] [facility]   5. Acute non-recurrent maxillary sinusitis  J01.00    6. Close exposure to COVID-19 virus  Z20.822      COVID performed, will call with results.     PE is positive for sinus tenderness, nasal congestion, clear lungs.  Start antibiotics.  OTC meds, push fluids as needed.  Go to the ER if difficulty breathing occurs.  Self isolation until results are in.  Questions answered.  f/u prn.  Pt verbalizes understanding.       Return if symptoms worsen or fail to improve.    The supervising/collaborating physician for this visit was Dr. Lucia Bitter.    Otho Darner, PA-C  02/08/2020, 11:45

## 2020-02-09 LAB — COVID-19 ~~LOC~~ MOLECULAR LAB TESTING: SARS-CoV-2: NOT DETECTED

## 2020-08-25 ENCOUNTER — Encounter (INDEPENDENT_AMBULATORY_CARE_PROVIDER_SITE_OTHER): Payer: Self-pay | Admitting: NURSE PRACTITIONER

## 2020-08-25 ENCOUNTER — Ambulatory Visit (INDEPENDENT_AMBULATORY_CARE_PROVIDER_SITE_OTHER): Payer: 59 | Admitting: NURSE PRACTITIONER

## 2020-08-25 ENCOUNTER — Other Ambulatory Visit: Payer: Self-pay

## 2020-08-25 VITALS — BP 126/84 | HR 89 | Temp 98.6°F | Ht 71.0 in | Wt 244.5 lb

## 2020-08-25 DIAGNOSIS — Z716 Tobacco abuse counseling: Secondary | ICD-10-CM

## 2020-08-25 DIAGNOSIS — J0100 Acute maxillary sinusitis, unspecified: Secondary | ICD-10-CM

## 2020-08-25 DIAGNOSIS — F17210 Nicotine dependence, cigarettes, uncomplicated: Secondary | ICD-10-CM

## 2020-08-25 MED ORDER — AMOXICILLIN 875 MG-POTASSIUM CLAVULANATE 125 MG TABLET
1.00 | ORAL_TABLET | Freq: Two times a day (BID) | ORAL | 0 refills | Status: DC
Start: 2020-08-25 — End: 2021-02-16

## 2020-08-25 NOTE — Nursing Note (Signed)
Sx's started in the beginning of April. C/O facial pressure/pain, pressure above and below left eye, productive cough of green mucus. Pt states that he has been taking Mucinex and OTC sinus medication for his sx's. States that his sx's are worsening.   Riki Altes, RTR 08/25/2020 12:54

## 2020-08-25 NOTE — Progress Notes (Signed)
[address], [facility] [place]  [address] [name] [name]  [facility] [place] [address]  [facility]          History of Present Illness: [name] [name] is a 35 y.o. male who presents to the Urgent Care today with chief complaint of Sinus Infection      Patient presents with sinus pain pressure, sore throat, cough, congestion, chills.  Patient states he has had congestion since the beginning of April, but symptoms have progressively worsened.  Patient states his cough is producing green mucus.  He has been taking over-the-counter sinus medication for his symptoms with minimal relief.  No known exposure.    The history was provided by the patient   I reviewed and confirmed the patient's past medical history taken by the nurse or medical assistant with the addition of the following:    Past Medical History:   Diagnosis Date    Apical lung scarring     Asthma     Seizures             Past Surgical History:   Procedure Laterality Date    HX FOOT SURGERY              Allergies   Allergen Reactions    Dilantin [Phenytoin]     Tussin [Guaifenesin]         No current outpatient medications     Social History     Socioeconomic History    Marital status: Significant Other   Occupational History     Employer: [employer]   Tobacco Use    Smoking status: Current Every Day Smoker     Packs/day: 0.50     Years: 6.00     Pack years: 3.00     Types: Cigarettes    Smokeless tobacco: Never Used   Vaping Use    Vaping Use: Former   Substance and Sexual Activity    Alcohol use: Yes     Alcohol/week: 2.5 standard drinks     Types: 3 Cans of beer per week     Comment: Occasionally    Drug use: Never        Family Medical History:       Problem Relation (Age of Onset)    Cancer Maternal Grandmother    Diabetes Maternal Grandfather, Paternal Grandmother    Healthy Mother, Father    Heart Disease Maternal Grandfather    Hypertension (High Blood Pressure) Maternal Grandfather               Review of Systems    Constitutional: Positive for chills. Negative for fever.   HENT: Positive for congestion, sinus pressure, sinus pain and sore throat. Negative for ear pain.    Respiratory: Positive for cough.    Gastrointestinal: Negative.  Negative for nausea and vomiting.   Musculoskeletal: Negative for arthralgias and myalgias.   Skin: Negative.    Neurological: Negative.    All other systems reviewed and are negative.      Vital signs:   Vitals:    08/25/20 1249   Height: 1.803 m (5\' 11" )         Body mass index is 34.87 kg/m.   Facility age limit for growth percentiles is 20 years.  No LMP for male patient.    Physical Exam  Vitals reviewed.   Constitutional:       General: He is not in acute distress.     Appearance: He is ill-appearing.   HENT:  Head: Normocephalic.      Right Ear: Tympanic membrane, ear canal and external ear normal.      Left Ear: Tympanic membrane, ear canal and external ear normal.      Nose: Congestion present.      Right Sinus: Maxillary sinus tenderness present.      Left Sinus: Maxillary sinus tenderness present.      Mouth/Throat:      Pharynx: Oropharyngeal exudate and posterior oropharyngeal erythema present.   Eyes:      Extraocular Movements: Extraocular movements intact.   Cardiovascular:      Rate and Rhythm: Normal rate and regular rhythm.      Pulses: Normal pulses.   Pulmonary:      Effort: Pulmonary effort is normal.      Breath sounds: Wheezing present.   Abdominal:      General: Abdomen is flat.   Musculoskeletal:         General: Normal range of motion.      Cervical back: Normal range of motion. No tenderness.   Lymphadenopathy:      Cervical: No cervical adenopathy.   Skin:     General: Skin is warm and dry.      Capillary Refill: Capillary refill takes less than 2 seconds.   Neurological:      General: No focal deficit present.      Mental Status: He is alert and oriented to person, place, and time.   Psychiatric:         Mood and Affect: Mood normal.       Course: Condition  at discharge: Stable    [name] was seen today for sinus infection.    Diagnoses and all orders for this visit:    Acute non-recurrent maxillary sinusitis  -     amoxicillin-pot clavulanate (AUGMENTIN) 875-125 mg Oral Tablet; Take 1 Tablet by mouth Every 12 hours    Encounter for smoking cessation counseling  -bilateral expiratory wheezes noted during physical exam.  Patient states he .  Patient states he wishes to stop smoking in the future, but would need help.  Patient is not currently ready to stop smoking.  Encouraged patient to follow-up at clinic when he is ready to start.  Discussed several options with patient.       *Patient verbalized understanding of the assessment and plan.  *If symptoms are worsening or not improving the patient should return to the Urgent Care for further evaluation.  *Go to the Emergency Department immediately for further work up if any concerning symptoms develop.     APPID-Independent visit with no supervising physician on site   The offsite supervising and collaborating physician for this visit was Dr. Earlie Server, MD     Hampton Abbot, APRN 08/25/2020, 12:55

## 2021-02-16 ENCOUNTER — Other Ambulatory Visit: Payer: Self-pay

## 2021-02-16 ENCOUNTER — Encounter (INDEPENDENT_AMBULATORY_CARE_PROVIDER_SITE_OTHER): Payer: Self-pay | Admitting: Physician Assistant

## 2021-02-16 ENCOUNTER — Ambulatory Visit (INDEPENDENT_AMBULATORY_CARE_PROVIDER_SITE_OTHER): Payer: 59 | Admitting: Physician Assistant

## 2021-02-16 VITALS — BP 128/72 | HR 81 | Temp 98.3°F | Ht 70.0 in | Wt 238.1 lb

## 2021-02-16 DIAGNOSIS — K047 Periapical abscess without sinus: Secondary | ICD-10-CM

## 2021-02-16 DIAGNOSIS — R197 Diarrhea, unspecified: Secondary | ICD-10-CM

## 2021-02-16 MED ORDER — LOPERAMIDE 2 MG CAPSULE
2.00 mg | ORAL_CAPSULE | ORAL | 0 refills | Status: DC | PRN
Start: 2021-02-16 — End: 2021-09-02

## 2021-02-16 MED ORDER — AMOXICILLIN 875 MG-POTASSIUM CLAVULANATE 125 MG TABLET
1.00 | ORAL_TABLET | Freq: Two times a day (BID) | ORAL | 0 refills | Status: AC
Start: 2021-02-16 — End: 2021-02-26

## 2021-02-16 NOTE — Progress Notes (Signed)
[address] [place] [place]  [address] [address]  [facility] [place] [address]  [facility]     Name: [name] [name] MRN:  [id number]   Date: 02/16/2021 Age: 35 y.o.     02/17/2021 Office Visit with Nilsa Nutting, PA-C    Subjective     Chief Complaint   Patient presents with    Diarrhea    Nausea    Fever     HPI  Pt C/O of stomach pains, nausea and diarrhea. Decreased intake.  Generalized abdominal discomfort, cramping before BM.  For the past 2 days pt also has a bad toothache and left side of face is swollen. History of broken teeth.  Had dental appt tomorrow.  Pt has had a fever: subjective.  Pt has not taken any OTC medication.  -Daughter with diarrhea.    REVIEW OF SYSTEMS          Yes No   Yes No    Constitutional x  Fevers/chills Gastro x  Nausea/vomiting    x  Fatigue  x  Abdominal pain      Unexplained weight changes  x  Diarrhea   Skin  x Rash    Constipation      Changing lesions    Blood in stool   EENT   Loss of sense of taste or smell Genitourinary   Urinary changes     x Sore throat          Hearing/Vision changes Ortho   New muscle pains   Lungs x  Cough (rare, normal)    New joint pains      Shortness of Breath/difficulty breathing Neuro   Dizziness   Cardiovascular   Chest Pain/pressure    Headaches      Palpitations Psych   Depression/Anxiety     x Peripheral Edema    Insomnia   Heme/lymph          Endocrine/  Immunologic/  Allergy        Patient Active Problem List   Diagnosis    Frontal sinusitis    Spells     Allergies   Allergen Reactions    Dilantin [Phenytoin]     Tussin [Guaifenesin]      amoxicillin-pot clavulanate (AUGMENTIN) 875-125 mg Oral Tablet, Take 1 Tablet by mouth Every 12 hours (Patient not taking: Reported on 02/16/2021)    No facility-administered medications prior to visit.         Objective   BP 128/72    Pulse 81    Temp 36.8 C (98.3 F)    Ht 1.778 m (5\' 10" )    Wt 108 kg (238 lb 1.6 oz)    SpO2 95%    BMI 34.16 kg/m        Estimated body mass  index is 34.16 kg/m as calculated from the following:    Height as of this encounter: 1.778 m (5\' 10" ).    Weight as of this encounter: 108 kg (238 lb 1.6 oz).  Constitutional:  Well appearing, no acute distress, pleasant, well groomed   Eyes: sclera anicteric, EOMI   ENT:  External ears, nose, and throat normal.  Poor dentition, multiple broken teeth with fillings.  Swelling over the right maxillary sinus.  See photo below.  Erythema, swelling of the gum line surrounding upper right teeth without obvious abscess.   Neck: Supple, no masses   Lymphatic: No cervical or supraclavicular lymphadenopathy    Respiratory: Respiratory effort unlabored  Abdomen Soft, trace lower right quadrant tenderness, nondistended.   Musculoskeletal: Normal gait.    Skin: Warm and dry.  No rashes noted.   Neurological: CN II-XII grossly intact.  Awake and alert.   Psychological: Mood and affect are normal.  Speech clear. Judgement and insight age appropriate.                ASSESSMENT/PLAN:     ICD-10-CM    1. Dental infection  K04.7 amoxicillin-pot clavulanate (AUGMENTIN) 875-125 mg Oral Tablet      2. Diarrhea, unspecified type  R19.7 loperamide (IMODIUM) 2 mg Oral Capsule        Plan above.  Advised that Augmentin may cause diarrhea to worsen but feel the coverage for dental infection is most important.  Trial of loperamide.  If not improving in several days, needs to go to primary care for consideration for stool studies, metabolic panel, etc.    We discussed that he had trace right lower quadrant tenderness to deep palpation.  This does not seem consistent with presentation of appendicitis.  However, we talked 3 signs and symptoms of this, any knows if he develops any worsening pain, nausea vomiting, or any new symptoms such as fever, etc. he should go to the emergency department for further evaluation.    Follow up: as needed.    Today I reviewed the patient's history, nurse's note, vital signs, allergies, and medications.  [name] expressed understanding and agreement with the assessment and plan and intention to comply, including follow up as instructed or sooner if worsening.  All questions answered.    APPID-Independent visit with no supervising physician on site.  The offsite supervising/collaborating physician for this visit was Earlie Server. Lucia Bitter co-signs some notes as well.    Parts of this note were dictated.  Please inquire for clarification if any discrepancies in transcription may have occurred.    Nilsa Nutting, PA-C

## 2021-02-16 NOTE — Nursing Note (Signed)
Pt C/O of stomach pains, very nauseas and diarrhea  For the past 2 days pt also has a bad toothache and left side of face is swollen.   Pt has had a fever.  Pt has not taken any OTC mediciation    Raj Janus, Ambulatory Care Assistant  02/16/2021, 18:11

## 2021-09-02 ENCOUNTER — Encounter (INDEPENDENT_AMBULATORY_CARE_PROVIDER_SITE_OTHER): Payer: Self-pay | Admitting: NURSE PRACTITIONER

## 2021-09-02 ENCOUNTER — Other Ambulatory Visit: Payer: Self-pay

## 2021-09-02 ENCOUNTER — Ambulatory Visit (INDEPENDENT_AMBULATORY_CARE_PROVIDER_SITE_OTHER): Payer: 59 | Admitting: NURSE PRACTITIONER

## 2021-09-02 VITALS — BP 124/72 | HR 82 | Temp 99.0°F | Ht 71.0 in | Wt 246.9 lb

## 2021-09-02 DIAGNOSIS — K047 Periapical abscess without sinus: Secondary | ICD-10-CM

## 2021-09-02 MED ORDER — CLINDAMYCIN HCL 300 MG CAPSULE
300.00 mg | ORAL_CAPSULE | Freq: Four times a day (QID) | ORAL | 0 refills | Status: DC
Start: 2021-09-02 — End: 2021-09-09

## 2021-09-02 MED ORDER — CHLORHEXIDINE GLUCONATE 0.12 % MOUTHWASH
15.00 mL | MOUTHWASH | Freq: Two times a day (BID) | 0 refills | Status: AC
Start: 2021-09-02 — End: 2021-09-12

## 2021-09-02 NOTE — Nursing Note (Signed)
Pt presents today with an infection in his teeth. Pt states he has some soreness in his jaw on his left side. Pt states this has been an ongoing issue but he cant find a dentist that accepts his insurance.    [name] [name], RT(R) 09/02/2021 10:24

## 2021-09-02 NOTE — Progress Notes (Signed)
[facility]  [facility]  [facility]  Progress Note    Name: [name] [name] MRN:  [id number]   Date: 09/02/2021 Age: 36 y.o.         Chief Complaint:    Chief Complaint   Patient presents with   . Toothache     BP 124/72   Pulse 82   Temp 37.2 C (99 F)   Ht 1.803 m (5\' 11" )   Wt 112 kg (246 lb 14.4 oz)   SpO2 98%   BMI 34.44 kg/m       Facility age limit for growth percentiles is 20 years.    Subjective:  [name] [name] presents to the office today with tooth pain on his right upper. He states he has been having a hard time finding a dentist that takes his insurance. He says that he origionally broke his teeth off when having seizures and needs to get them extracted.           Review of Systems   Constitutional: Negative.    HENT: Positive for dental problem.    Eyes: Negative.    Respiratory: Negative.    Cardiovascular: Negative.    Gastrointestinal: Negative.    Endocrine: Negative.    Genitourinary: Negative.    Musculoskeletal: Negative.    Skin: Negative.    Allergic/Immunologic: Negative.    Neurological: Negative.    Hematological: Negative.    Psychiatric/Behavioral: Negative.        Past Medical History  Allergies   Allergen Reactions   . Dilantin [Phenytoin]    . Tussin [Guaifenesin]      Past Medical History:   Diagnosis Date   . Apical lung scarring    . Asthma    . Seizures      Past Surgical History:   Procedure Laterality Date   . HX FOOT SURGERY       Family Medical History:     Problem Relation (Age of Onset)    Cancer Maternal Grandmother    Diabetes Maternal Grandfather, Paternal Grandmother    Healthy Mother, Father    Heart Disease Maternal Grandfather    Hypertension (High Blood Pressure) Maternal Grandfather          Social History     Socioeconomic History   . Marital status: Significant Other   Occupational History     Employer: [employer]   Tobacco Use   . Smoking status: Every Day     Packs/day: 0.50     Years: 6.00     Pack  years: 3.00     Types: Cigarettes   . Smokeless tobacco: Never   Vaping Use   . Vaping Use: Former   Substance and Sexual Activity   . Alcohol use: Yes     Alcohol/week: 2.5 standard drinks     Types: 3 Cans of beer per week     Comment: Occasionally   . Drug use: Never       Objective:    Physical Exam  Vitals and nursing note reviewed.   Constitutional:       Appearance: Normal appearance.   HENT:      Head: Normocephalic and atraumatic.      Right Ear: Tympanic membrane, ear canal and external ear normal.      Left Ear: Tympanic membrane, ear canal and external ear normal.      Nose: Nose normal.      Mouth/Throat:  Mouth: Mucous membranes are moist.      Dentition: Abnormal dentition. Gingival swelling and dental abscesses present.      Pharynx: Oropharynx is clear.   Eyes:      Extraocular Movements: Extraocular movements intact.      Conjunctiva/sclera: Conjunctivae normal.      Pupils: Pupils are equal, round, and reactive to light.   Cardiovascular:      Rate and Rhythm: Normal rate and regular rhythm.      Heart sounds: Normal heart sounds.   Pulmonary:      Effort: Pulmonary effort is normal.      Breath sounds: Normal breath sounds.   Abdominal:      General: Abdomen is flat. Bowel sounds are normal.      Palpations: Abdomen is soft.   Musculoskeletal:         General: Normal range of motion.      Cervical back: Normal range of motion and neck supple.   Skin:     General: Skin is warm and dry.   Neurological:      General: No focal deficit present.      Mental Status: He is alert and oriented to person, place, and time.      Deep Tendon Reflexes: Reflexes are normal and symmetric.   Psychiatric:         Mood and Affect: Mood normal.         Behavior: Behavior normal.         Thought Content: Thought content normal.         Judgment: Judgment normal.         Data Reviewed:    Current Outpatient Medications:   .  chlorhexidine gluconate (PERIDEX) 0.12 % Mucous Membrane Mouthwash, Swish and spit 15 mL  Twice daily for 10 days, Disp: 300 mL, Rfl: 0  .  clindamycin (CLEOCIN) 300 mg Oral Capsule, Take 1 Capsule (300 mg total) by mouth Four times a day for 10 days, Disp: 40 Capsule, Rfl: 0    Fall Risk Assessment         PHQ Questionnaire             The ASCVD Risk score (Arnett DK, et al., 2019) failed to calculate for the following reasons:    The 2019 ASCVD risk score is only valid for ages 53 to 44    POCT Results:                             No results found for: STREPPCR, RAPIDSTREP, THSC    No results found for: SARSCOVPOC, FLUAPOC, FLUBPOC, RSVP2POC    I have reviewed and confirmed the above point of care results.  Marygrace Drought, CRNP    Nursing Notes:   Dimas Chyle, RTR  09/02/21 1027  Signed  Pt presents today with an infection in his teeth. Pt states he has some soreness in his jaw on his left side. Pt states this has been an ongoing issue but he cant find a dentist that accepts his insurance.    Dimas Chyle, RT(R) 09/02/2021 10:24        Assessment/Plan:    Zakkary was seen today for toothache.    Diagnoses and all orders for this visit:    Dental abscess  -     chlorhexidine gluconate (PERIDEX) 0.12 % Mucous Membrane Mouthwash; Swish and spit 15 mL Twice daily for 10 days  -  clindamycin (CLEOCIN) 300 mg Oral Capsule; Take 1 Capsule (300 mg total) by mouth Four times a day for 10 days                Return if symptoms worsen or fail to improve.    The patient has been educated and verbalized understanding regarding the services provided during this visit.    This visit was rendered independently without a supervising/collaborating physician on site.       The offsite supervising/collaborating physician for this visit was Dr. Earlie Server.    Marygrace Drought, CRNP

## 2021-09-08 IMAGING — CR DG CHEST 2V
2 series · 2 of 2 positions shown · non-contrast
Comparison: None.

CLINICAL DATA: Dyspnea on exertion. Chest pain of uncertain
etiology.

EXAM:
CHEST - 2 VIEW

[w chest pa]
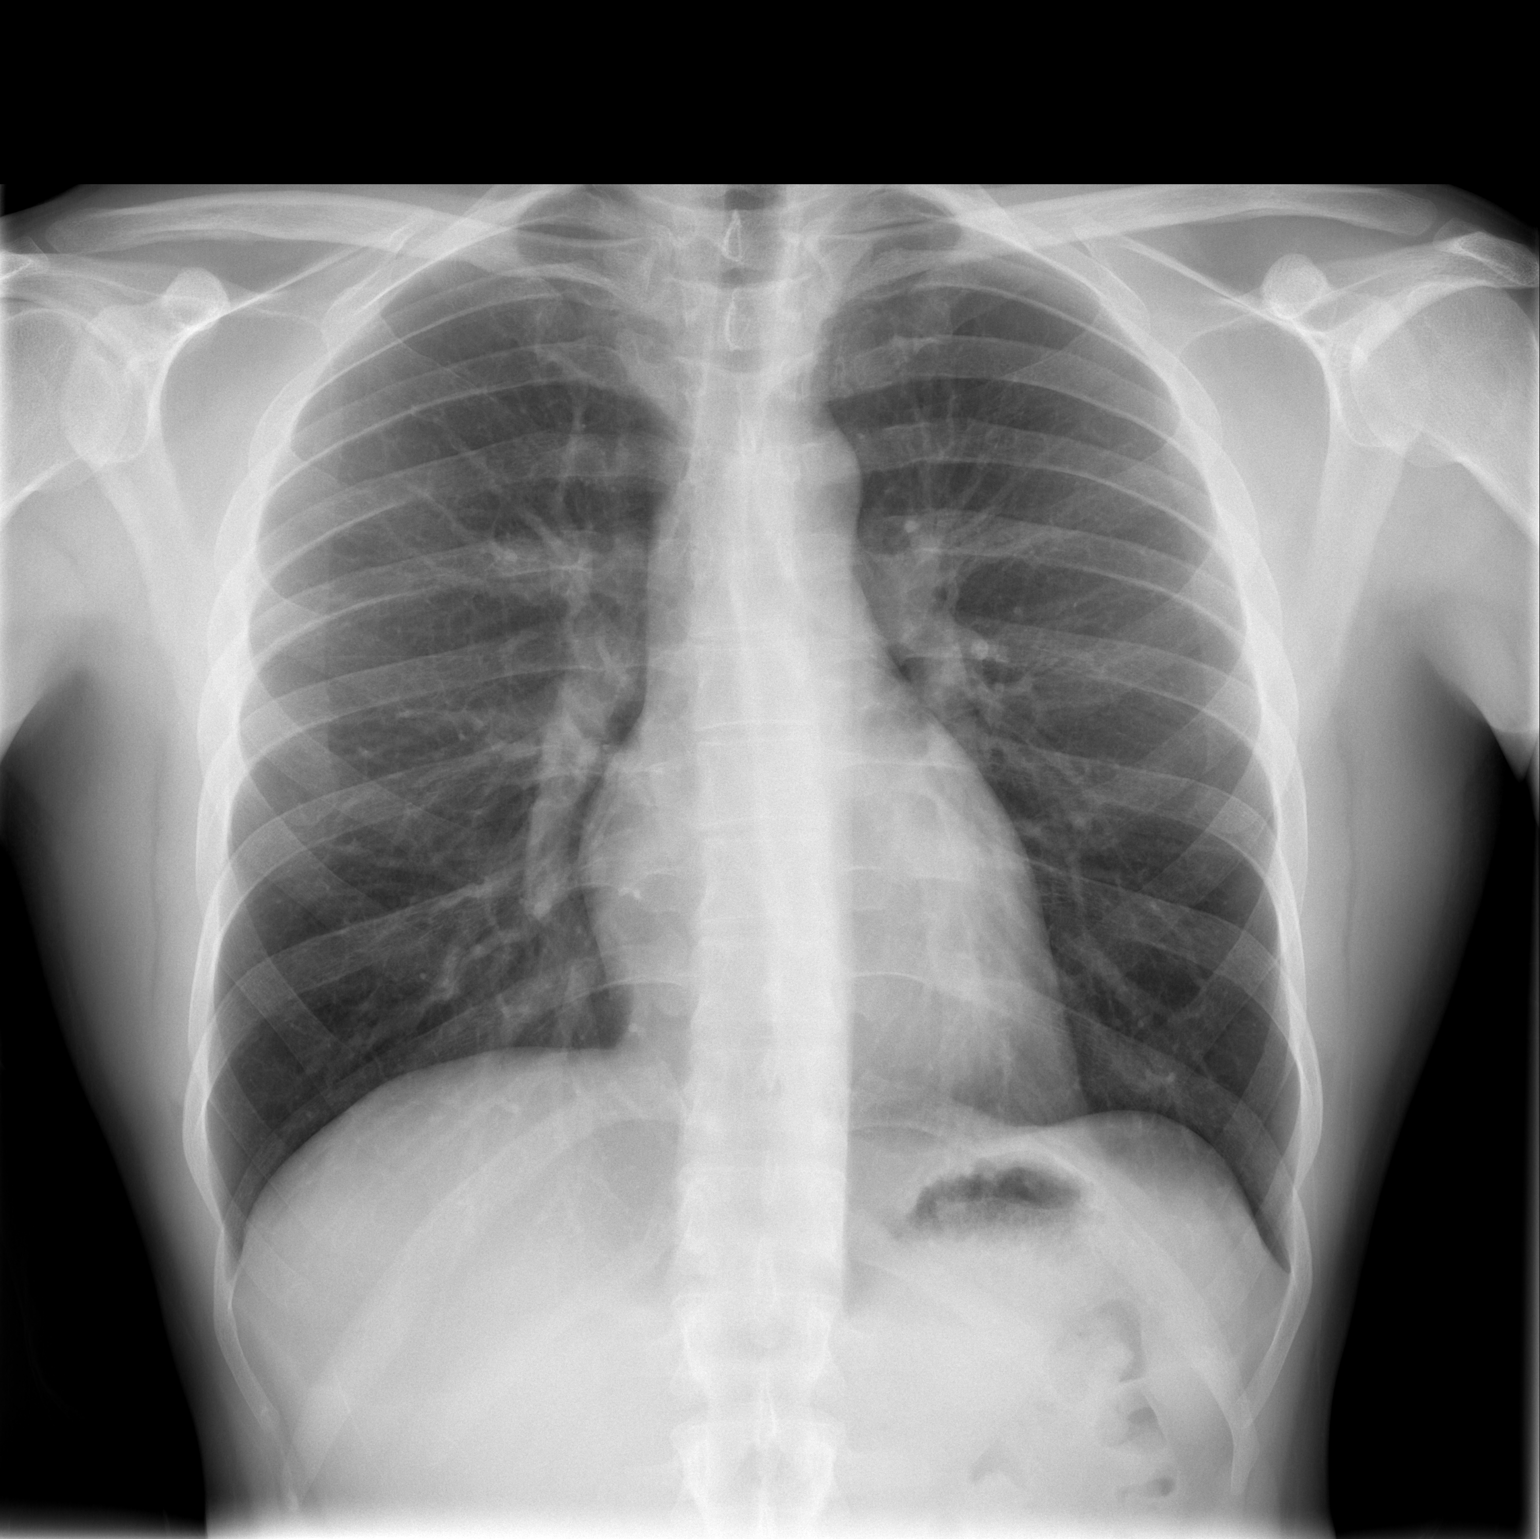

[w chest lat]
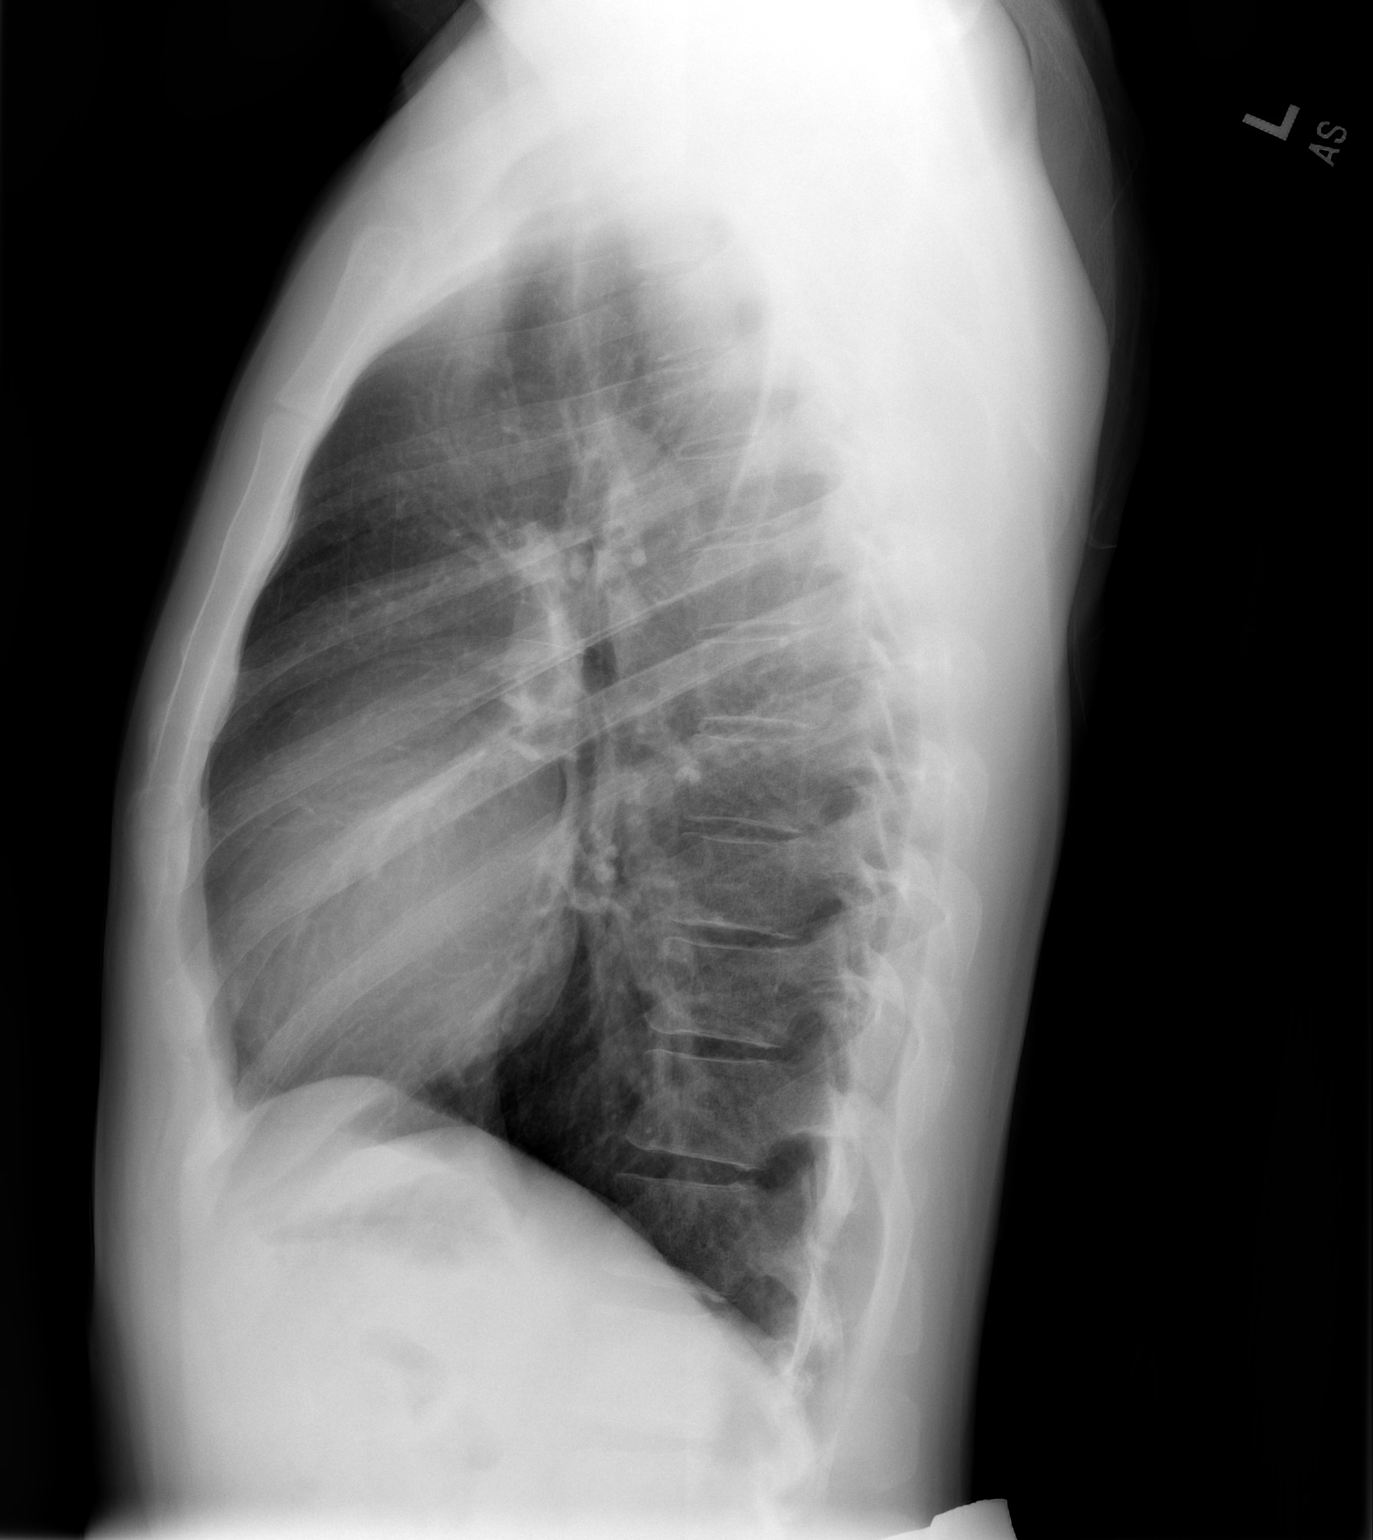

[2 of 2 positions shown; findings below may reference images not displayed]

FINDINGS: The cardiomediastinal silhouette is within normal limits. The lungs
are well inflated and clear. There is no evidence of pleural
effusion or pneumothorax. No acute osseous abnormality is
identified.
IMPRESSION: No active cardiopulmonary disease.

## 2021-09-09 ENCOUNTER — Ambulatory Visit (INDEPENDENT_AMBULATORY_CARE_PROVIDER_SITE_OTHER): Payer: Self-pay

## 2021-09-09 DIAGNOSIS — K047 Periapical abscess without sinus: Secondary | ICD-10-CM

## 2021-09-09 MED ORDER — CLINDAMYCIN HCL 300 MG CAPSULE
300.00 mg | ORAL_CAPSULE | Freq: Four times a day (QID) | ORAL | 0 refills | Status: AC
Start: 2021-09-09 — End: 2021-09-19

## 2021-09-09 MED ORDER — CHLORHEXIDINE GLUCONATE 0.12 % MOUTHWASH
15.00 mL | MOUTHWASH | Freq: Two times a day (BID) | 0 refills | Status: DC
Start: 2021-09-09 — End: 2021-09-09

## 2021-09-09 MED ORDER — CLINDAMYCIN HCL 300 MG CAPSULE
300.00 mg | ORAL_CAPSULE | Freq: Four times a day (QID) | ORAL | 0 refills | Status: DC
Start: 2021-09-09 — End: 2021-09-09

## 2021-09-09 MED ORDER — CHLORHEXIDINE GLUCONATE 0.12 % MOUTHWASH
15.00 mL | MOUTHWASH | Freq: Two times a day (BID) | 0 refills | Status: AC
Start: 2021-09-09 — End: 2021-09-19

## 2021-09-09 NOTE — Telephone Encounter (Signed)
Meds sent to [facility] in [place].     Hampton Abbot, APRN

## 2021-09-09 NOTE — Telephone Encounter (Signed)
Patient aware Elba Barman, RT(R) 09/09/2021 14:14
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
## 2021-10-06 ENCOUNTER — Ambulatory Visit
Admission: RE | Admit: 2021-10-06 | Discharge: 2021-10-06 | Disposition: A | Discharge status: Home / Self Care | Payer: BC Managed Care – PPO | Source: Ambulatory Visit | Attending: Physician Assistant | Admitting: Physician Assistant

## 2021-10-06 ENCOUNTER — Other Ambulatory Visit: Payer: Self-pay | Admitting: Physician Assistant

## 2021-10-06 DIAGNOSIS — R059 Cough, unspecified: Secondary | ICD-10-CM

## 2022-06-19 ENCOUNTER — Encounter (INDEPENDENT_AMBULATORY_CARE_PROVIDER_SITE_OTHER): Payer: 59

## 2022-06-19 ENCOUNTER — Encounter (INDEPENDENT_AMBULATORY_CARE_PROVIDER_SITE_OTHER): Payer: Self-pay

## 2022-06-19 ENCOUNTER — Ambulatory Visit (INDEPENDENT_AMBULATORY_CARE_PROVIDER_SITE_OTHER): Payer: 59 | Admitting: NURSE PRACTITIONER, FAMILY

## 2022-06-19 ENCOUNTER — Other Ambulatory Visit: Payer: Self-pay

## 2022-06-19 VITALS — BP 140/81 | HR 80 | Temp 98.1°F | Ht 71.0 in | Wt 240.8 lb

## 2022-06-19 DIAGNOSIS — J101 Influenza due to other identified influenza virus with other respiratory manifestations: Secondary | ICD-10-CM

## 2022-06-19 DIAGNOSIS — M791 Myalgia, unspecified site: Secondary | ICD-10-CM

## 2022-06-19 DIAGNOSIS — Z20828 Contact with and (suspected) exposure to other viral communicable diseases: Secondary | ICD-10-CM

## 2022-06-19 LAB — POC COVID-19, FLU A/B, RSV RAPID BY PCR (RESULTS)
INFLUENZA VIRUS A, PCR 4PLEX, POC: NEGATIVE
INFLUENZA VIRUS B, PCR 4PLEX, POC: POSITIVE — AB
RSV, PCR 4PLEX, POC: NEGATIVE
SARS-COV-2, POC: NEGATIVE

## 2022-06-19 MED ORDER — ALBUTEROL SULFATE HFA 90 MCG/ACTUATION AEROSOL INHALER
1.00 | INHALATION_SPRAY | Freq: Four times a day (QID) | RESPIRATORY_TRACT | 0 refills | Status: DC | PRN
Start: 2022-06-19 — End: 2022-10-08

## 2022-06-19 MED ORDER — PREDNISONE 20 MG TABLET
40.0000 mg | ORAL_TABLET | Freq: Every day | ORAL | 0 refills | Status: AC
Start: 2022-06-19 — End: 2022-06-24

## 2022-06-19 MED ORDER — BENZONATATE 200 MG CAPSULE
200.00 mg | ORAL_CAPSULE | Freq: Three times a day (TID) | ORAL | 0 refills | Status: DC | PRN
Start: 2022-06-19 — End: 2022-10-08

## 2022-06-19 NOTE — Progress Notes (Signed)
[address], [facility] [place]  [address]  [place] [address]          History of Present Illness: [name] [name] is a 38 y.o. male who presents to the Urgent Care today with chief complaint of    Chief Complaint              Myalgia     Chills     Fatigue               Pt presents to urgent care today with c/o cough and weakness.  Symptom onset 5 days ago.  His family has the flu currently.  He is having body aches feeling very tired coughing and congested.  The cough is keeping him up.  No fevers in the last day.            I reviewed and confirmed the patient's past medical history taken by the nurse or medical assistant with the addition of the following:    Past Medical History:    Past Medical History:   Diagnosis Date    Apical lung scarring     Asthma     Seizures        Past Surgical History:    Past Surgical History:   Procedure Laterality Date    HX FOOT SURGERY         Allergies:  Allergies   Allergen Reactions    Dilantin [Phenytoin] Hives/ Urticaria    Tussin [Guaifenesin] Hives/ Urticaria     Childhood Allergy     Medications:    Current Outpatient Medications   Medication Sig    albuterol sulfate (PROVENTIL OR VENTOLIN OR PROAIR) 90 mcg/actuation Inhalation oral inhaler Take 1-2 Puffs by inhalation Every 6 hours as needed    Benzonatate (TESSALON) 200 mg Oral Capsule Take 1 Capsule (200 mg total) by mouth Three times a day as needed for Cough    predniSONE (DELTASONE) 20 mg Oral Tablet Take 2 Tablets (40 mg total) by mouth Once a day for 5 days     Social History:    Social History     Tobacco Use    Smoking status: Every Day     Current packs/day: 0.50     Average packs/day: 0.5 packs/day for 6.0 years (3.0 ttl pk-yrs)     Types: Cigarettes    Smokeless tobacco: Never   Vaping Use    Vaping status: Former   Substance Use Topics    Alcohol use: Yes     Alcohol/week: 2.5 standard drinks of alcohol     Types: 3 Cans of beer per week     Comment: Occasionally    Drug use: Never      Family History:  Family Medical History:       Problem Relation (Age of Onset)    Cancer Maternal Grandmother    Diabetes Maternal Grandfather, Paternal 71    Healthy Mother, Father    Heart Disease Maternal Grandfather    Hypertension (High Blood Pressure) Maternal Grandfather            Review of Systems:    Other than ROS in HPI, all other systems are negative.       Physical Exam:  Vital signs:   Vitals:    06/19/22 1354   BP: (!) 140/81   Pulse: 80   Temp: 36.7 C (98.1 F)   TempSrc: Thermal Scan   SpO2: 96%   Weight: 109 kg (240 lb 12.8 oz)  Height: 1.803 m ('5\' 11"'$ )   BMI: 33.66       Body mass index is 33.58 kg/m. Facility age limit for growth %iles is 20 years.  No LMP for male patient.    Physical Exam  Vitals and nursing note reviewed.   Constitutional:       General: He is not in acute distress.     Appearance: Normal appearance. He is not ill-appearing or toxic-appearing.   HENT:      Head: Normocephalic and atraumatic.      Right Ear: Tympanic membrane normal.      Left Ear: Tympanic membrane normal.      Nose: Congestion present.      Mouth/Throat:      Mouth: Mucous membranes are moist.      Pharynx: No posterior oropharyngeal erythema.   Eyes:      Extraocular Movements: Extraocular movements intact.      Conjunctiva/sclera: Conjunctivae normal.      Pupils: Pupils are equal, round, and reactive to light.   Cardiovascular:      Rate and Rhythm: Normal rate and regular rhythm.      Pulses: Normal pulses.      Heart sounds: Normal heart sounds. No murmur heard.  Pulmonary:      Effort: Pulmonary effort is normal. No respiratory distress.      Breath sounds: Wheezing and rhonchi present.   Abdominal:      General: There is no distension.      Palpations: Abdomen is soft.      Tenderness: There is no abdominal tenderness.   Musculoskeletal:         General: Normal range of motion.      Cervical back: Normal range of motion.   Lymphadenopathy:      Cervical: No cervical adenopathy.    Skin:     General: Skin is warm.      Capillary Refill: Capillary refill takes less than 2 seconds.      Findings: No rash.   Neurological:      General: No focal deficit present.      Mental Status: He is alert and oriented to person, place, and time.             Data Reviewed:      Results for orders placed or performed in visit on 06/19/22 (from the past 12 hour(s))   POC COVID-19, FLU A/B, RSV RAPID BY PCR (RESULTS)   Result Value Ref Range    SARS-COV-2, POC Negative Negative    INFLUENZA VIRUS A, PCR 4PLEX, POC Negative Negative    INFLUENZA VIRUS B, PCR 4PLEX, POC Positive (A) Negative    RSV, PCR 4PLEX, POC Negative Negative     Recent Results (from the past [id number] hour(s))   XR CHEST PA AND LATERAL    Collection Time: 06/19/22  2:44 PM    Narrative    Ronnette Hila      Male, 37 years old.    XR CHEST PA AND LATERAL performed on 06/19/2022 2:44 PM.    REASON FOR EXAM:  M79.10: Myalgia  Z20.828: Exposure to influenza    TECHNIQUE: 2 views/2 images submitted for interpretation.    COMPARISON:  None    FINDINGS:  Heart and mediastinum are normal. The lungs are clear and there is no pleural fluid.      Impression    No acute process      Radiologist location ID: [id number]  Course: Condition at discharge: Good  and Stable    Differential Diagnosis:  Influenza vs COVID vs bronchitis     Assessment:   Assessment/Plan   1. Myalgia    2. Exposure to influenza    3. Influenza B      Plan:    Orders Placed This Encounter    XR CHEST PA AND LATERAL    Perform POC Covid-19, Flu A/B, [place] Rapid by PCR    predniSONE (DELTASONE) 20 mg Oral Tablet    albuterol sulfate (PROVENTIL OR VENTOLIN OR PROAIR) 90 mcg/actuation Inhalation oral inhaler    Benzonatate (TESSALON) 200 mg Oral Capsule      Chest x-ray is reassuring.  Positive for influenza B.  Prednisone Tessalon and albuterol as needed.  Expected course of illness discussed.           Go to Emergency Department immediately for further work up if any  concerning symptoms.  Plan was discussed and patient verbalized understanding. If symptoms are worsening or not improving the patient should return to the urgent care for further evaluation.        Cleda Daub, APRN,NP-C 06/19/2022, 14:17

## 2022-06-19 NOTE — Nursing Note (Signed)
Results for orders placed or performed in visit on 06/19/22 (from the past 12 hour(s))   POC COVID-19, FLU A/B, RSV RAPID BY PCR (RESULTS)   Result Value Ref Range    SARS-COV-2, POC Negative Negative    INFLUENZA VIRUS A, PCR 4PLEX, POC Negative Negative    INFLUENZA VIRUS B, PCR 4PLEX, POC Positive (A) Negative    RSV, PCR 4PLEX, POC Negative Negative     Pt notified in office of test results.   Jannifer Rodney, RT(R) 06/19/2022 15:19

## 2022-06-19 NOTE — Nursing Note (Signed)
Pt presents with c/o hot/cold flashes, feeling delerious, difficulty focusing, fatigue, weakness, body aches, and chills that started Friday. Pt states that his son and girlfriend both have Influenza virus B. His sx's have improved but he cannot shake feeling bad. Pt has been taking Dayquil and Nyquil for the sx's.  Jannifer Rodney, RT(R) 06/19/2022 14:09

## 2022-08-10 ENCOUNTER — Other Ambulatory Visit: Payer: Self-pay | Admitting: Family Medicine

## 2022-08-10 DIAGNOSIS — R1031 Right lower quadrant pain: Secondary | ICD-10-CM

## 2022-08-20 ENCOUNTER — Ambulatory Visit
Admission: RE | Admit: 2022-08-20 | Discharge: 2022-08-20 | Disposition: A | Discharge status: Home / Self Care | Payer: BC Managed Care – PPO | Source: Ambulatory Visit | Attending: Family Medicine | Admitting: Family Medicine

## 2022-08-20 ENCOUNTER — Other Ambulatory Visit: Payer: Self-pay | Admitting: Family Medicine

## 2022-08-20 DIAGNOSIS — R1031 Right lower quadrant pain: Secondary | ICD-10-CM

## 2022-08-20 DIAGNOSIS — R1032 Left lower quadrant pain: Secondary | ICD-10-CM

## 2022-08-21 ENCOUNTER — Other Ambulatory Visit: Payer: Self-pay | Admitting: Family Medicine

## 2022-08-21 DIAGNOSIS — R1031 Right lower quadrant pain: Secondary | ICD-10-CM

## 2022-08-22 ENCOUNTER — Other Ambulatory Visit: Payer: BC Managed Care – PPO

## 2022-08-28 ENCOUNTER — Ambulatory Visit
Admission: RE | Admit: 2022-08-28 | Discharge: 2022-08-28 | Disposition: A | Discharge status: Home / Self Care | Payer: BC Managed Care – PPO | Source: Ambulatory Visit | Attending: Family Medicine | Admitting: Family Medicine

## 2022-08-28 DIAGNOSIS — R1031 Right lower quadrant pain: Secondary | ICD-10-CM

## 2022-09-27 NOTE — Progress Notes (Signed)
Cardiology Office Note   Date:  09/28/2022   ID:  Thomas Alvarado, DOB January 04, 1986, MRN [id number]  PCP:  Irven Coe, MD  Cardiologist:   None Referring:  Irven Coe, MD  Chief Complaint  Patient presents with   Abnormal ECG      History of Present Illness: Thomas Alvarado is a 37 y.o. male who presents for evaluation of an abnormal EKG. he saw Dr. Tresa Endo in 2021 for chest pain post-COVID   he did have an echocardiogram at that time that was essentially unremarkable.  He presents because of occasional chest pressure.  This happens sporadically.  He is able to do activities such as lots of yard work without bringing this on.  He has some sporadic chest pressure.  He did have an EKG and there was poor anterior R wave progression possible anteroseptal Q waves.  This was somewhat different than her previous EKG in 2021.  There were nonspecific ST changes.  He does not have classic cardiovascular symptoms.  He does not describe exertional chest pressure, neck or arm discomfort.  He does not have significant shortness of breath, PND or orthopnea.  He had no weight gain or edema.   Past Medical History:  Diagnosis Date   Seasonal allergies     Past Surgical History:  Procedure Laterality Date   None       Current Outpatient Medications  Medication Sig Dispense Refill   montelukast (SINGULAIR) 10 MG tablet Take 1 tablet by mouth daily.     fluticasone (FLONASE) 50 MCG/ACT nasal spray Place 1 spray into the nose as directed.     No current facility-administered medications for this visit.    Allergies:   Cefaclor    Social History:  The patient  reports that he has never smoked. He has never used smokeless tobacco.   Family History:  The patient's family history is not on file.    ROS:  Please see the history of present illness.   Otherwise, review of systems are positive for none.   All other systems are reviewed and negative.    PHYSICAL EXAM: VS:  BP 104/68  (BP Location: Right Arm, Patient Position: Sitting, Cuff Size: Normal)   Pulse (!) 44   Ht 5\' 9"  (1.753 m)   Wt 149 lb 9.6 oz (67.9 kg)   SpO2 99%   BMI 22.09 kg/m  , BMI Body mass index is 22.09 kg/m. GENERAL:  Well appearing HEENT:  Pupils equal round and reactive, fundi not visualized, oral mucosa unremarkable NECK:  No jugular venous distention, waveform within normal limits, carotid upstroke brisk and symmetric, no bruits, no thyromegaly LYMPHATICS:  No cervical, inguinal adenopathy LUNGS:  Clear to auscultation bilaterally BACK:  No CVA tenderness CHEST:  Unremarkable HEART:  PMI not displaced or sustained,S1 and S2 within normal limits, no S3, no S4, no clicks, no rubs, no murmurs ABD:  Flat, positive bowel sounds normal in frequency in pitch, no bruits, no rebound, no guarding, no midline pulsatile mass, no hepatomegaly, no splenomegaly EXT:  2 plus pulses throughout, no edema, no cyanosis no clubbing SKIN:  No rashes no nodules NEURO:  Cranial nerves II through XII grossly intact, motor grossly intact throughout [facility]:  Cognitively intact, oriented to person place and time    EKG:  EKG Interpretation  Date/Time:  Friday September 28 2022 09:23:19 EDT Ventricular Rate:  44 PR Interval:  146 QRS Duration: 96 QT Interval:  420 QTC Calculation: 359 R  Axis:   28 Text Interpretation: Marked sinus bradycardia  Poor anterior R wave progression more pronounced than previous. Confirmed by Rollene Rotunda ([id number]) on 09/28/2022 1:09:13 PM     Recent Labs: No results found for requested labs within last 365 days.    Lipid Panel No results found for: "CHOL", "TRIG", "HDL", "CHOLHDL", "VLDL", "LDLCALC", "LDLDIRECT"    Wt Readings from Last 3 Encounters:  09/28/22 149 lb 9.6 oz (67.9 kg)  05/25/19 165 lb 3.2 oz (74.9 kg)      Other studies Reviewed: Additional studies/ records that were reviewed today include: EKG and labs. Review of the above records demonstrates:  Please  see elsewhere in the note.     ASSESSMENT AND PLAN:  ABNORMAL EKG: The patient's EKG is slightly different with poor anterior R wave progression although I do not strongly suspect an anteroseptal infarct.  I will check an echo cardiogram.  She is unremarkable as suspected and in particular as compared to 2021 note change in therapy or further testing will be indicated.  CHEST PAIN: This chest pain sounds not anginal.  No change in therapy.  Current medicines are reviewed at length with the patient today.  The patient does not have concerns regarding medicines.  The following changes have been made:  no change  Labs/ tests ordered today include:   Orders Placed This Encounter  Procedures   EKG 12-Lead   ECHOCARDIOGRAM COMPLETE     Disposition:   FU with me as needed.     Signed, Rollene Rotunda, MD  09/28/2022 1:12 PM    [facility]

## 2022-09-28 ENCOUNTER — Ambulatory Visit: Payer: BC Managed Care – PPO | Attending: Cardiology | Admitting: Cardiology

## 2022-09-28 ENCOUNTER — Encounter: Payer: Self-pay | Admitting: Cardiology

## 2022-09-28 VITALS — BP 104/68 | HR 44 | Ht 69.0 in | Wt 149.6 lb

## 2022-09-28 DIAGNOSIS — R9431 Abnormal electrocardiogram [ECG] [EKG]: Secondary | ICD-10-CM

## 2022-09-28 NOTE — Patient Instructions (Signed)
  Testing/Procedures:  Your physician has requested that you have an echocardiogram. Echocardiography is a painless test that uses sound waves to create images of your heart. It provides your doctor with information about the size and shape of your heart and how well your heart's chambers and valves are working. This procedure takes approximately one hour. There are no restrictions for this procedure. Please do NOT wear cologne, perfume, aftershave, or lotions (deodorant is allowed). Please arrive 15 minutes prior to your appointment time. [address]   Follow-Up: At [facility], you and your health needs are our priority.  As part of our continuing mission to provide you with exceptional heart care, we have created designated Provider Care Teams.  These Care Teams include your primary Cardiologist (physician) and Advanced Practice Providers (APPs -  Physician Assistants and Nurse Practitioners) who all work together to provide you with the care you need, when you need it.  We recommend signing up for the patient portal called "MyChart".  Sign up information is provided on this After Visit Summary.  MyChart is used to connect with patients for Virtual Visits (Telemedicine).  Patients are able to view lab/test results, encounter notes, upcoming appointments, etc.  Non-urgent messages can be sent to your provider as well.   To learn more about what you can do with MyChart, go to [email or web address].    Your next appointment:    AS NEEDED   

## 2022-10-08 ENCOUNTER — Encounter (INDEPENDENT_AMBULATORY_CARE_PROVIDER_SITE_OTHER): Payer: Self-pay

## 2022-10-08 ENCOUNTER — Other Ambulatory Visit: Payer: Self-pay

## 2022-10-08 ENCOUNTER — Ambulatory Visit (INDEPENDENT_AMBULATORY_CARE_PROVIDER_SITE_OTHER): Payer: 59 | Admitting: Physician Assistant

## 2022-10-08 VITALS — BP 144/82 | HR 78 | Temp 97.7°F | Ht 71.0 in | Wt 253.1 lb

## 2022-10-08 DIAGNOSIS — K047 Periapical abscess without sinus: Secondary | ICD-10-CM

## 2022-10-08 MED ORDER — AMOXICILLIN 875 MG-POTASSIUM CLAVULANATE 125 MG TABLET
1.0000 | ORAL_TABLET | Freq: Two times a day (BID) | ORAL | 0 refills | Status: AC
Start: 2022-10-08 — End: 2022-10-18

## 2022-10-08 MED ORDER — IBUPROFEN 600 MG TABLET
600.00 mg | ORAL_TABLET | Freq: Four times a day (QID) | ORAL | 0 refills | Status: AC | PRN
Start: 2022-10-08 — End: 2022-10-18

## 2022-10-08 NOTE — Patient Instructions (Signed)
Thank you for your visit today!  I hope you are feeling better soon.  You should be gradually improving, so if you develop new or worsening symptoms, you will need to be re-evaluated.    If you are interested in reading more about medical conditions, the National Institute of Health and CDC websites are excellent resources.

## 2022-10-08 NOTE — Progress Notes (Signed)
[address] [facility] [address]  [address] [address]  [facility] [place] [id number]       Name: [name] [name] MRN:  [id number]   Date: 10/08/2022 Age: 37 y.o.     10/08/2022 Office Visit with [name] Nutting, PA-C    Subjective     Chief Complaint   Patient presents with    Infection    Dental Problem     HPI  Pt states he has a tooth ache that might be infected: upper central teeth that have been broken for years. It been going on for about 2 weeks but the infection and swelling started last night. Tried to get to the dentist but was unable. Pt has taken Advil.  Hasn't made dental appt yet.      REVIEW OF SYSTEMS          Yes No   Yes No    Constitutional  X Fevers/chills Gastro   Nausea/vomiting     X Fatigue    Abdominal pain      Unexplained weight changes    Diarrhea   Skin   Rash    Constipation      Changing lesions    Blood in stool   EENT   Loss of sense of taste or smell Genitourinary   Urinary changes      Sore throat         X Hearing/Vision changes Ortho   New muscle pains   Lungs   Cough    New joint pains      Shortness of Breath/difficulty breathing Neuro  X Dizziness   Cardiovascular   Chest Pain/pressure   X Headaches      Palpitations Psych   Depression/Anxiety      Peripheral Edema    Insomnia   Heme/lymph          Endocrine/  Immunologic/  Allergy        Patient Active Problem List   Diagnosis    Frontal sinusitis    Spells     Allergies   Allergen Reactions    Tramadol Rash    Dilantin [Phenytoin] Hives/ Urticaria    Tussin [Guaifenesin] Hives/ Urticaria     Childhood Allergy     albuterol sulfate (PROVENTIL OR VENTOLIN OR PROAIR) 90 mcg/actuation Inhalation oral inhaler, Take 1-2 Puffs by inhalation Every 6 hours as needed  Benzonatate (TESSALON) 200 mg Oral Capsule, Take 1 Capsule (200 mg total) by mouth Three times a day as needed for Cough    No facility-administered medications prior to visit.         Objective   BP (!) 144/82   Pulse 78   Temp 36.5 C (97.7 F)   Ht 1.803 m (5\' 11" )   Wt 115 kg  (253 lb 1.6 oz)   SpO2 97%   BMI 35.30 kg/m        Estimated body mass index is 35.3 kg/m as calculated from the following:    Height as of this encounter: 1.803 m (5\' 11" ).    Weight as of this encounter: 115 kg (253 lb 1.6 oz).  Constitutional:  Well appearing, no acute distress, pleasant, well groomed   Eyes: sclera anicteric, EOMI   ENT:  External ears, nose, and throat normal. Oropharynx clear and moist without erythema or exudate. Multiple broken teeth.  Effected upper teeth shown in photo.  Multiple fractured teeth and caries.  Uvula midline. Tympanic membranes and canals normal. Nares normal.  No mastoid tenderness. Mild swelling  of right cheek: would not be noticeable if not paying close attention.   Neck: Supple, no masses   Lymphatic: No cervical or supraclavicular lymphadenopathy    Respiratory: Respiratory effort unlabored      Musculoskeletal: Normal gait.    Skin: Warm and dry.  No rashes noted.   Neurological: CN II-XII grossly intact.  Awake and alert.   Psychological: Mood and affect are normal.  Speech clear. Judgement and insight age appropriate.                ASSESSMENT/PLAN:     ICD-10-CM    1. Dental infection  K04.7 amoxicillin-pot clavulanate (AUGMENTIN) 875-125 mg Oral Tablet     Ibuprofen (MOTRIN) 600 mg Oral Tablet          Follow up: as needed.    Today I reviewed the patient's history, nurse's note, vital signs, allergies, and medications. [name] expressed understanding and agreement with the assessment and plan and intention to comply, including follow up as instructed or sooner if worsening.  All questions answered.    APPID-Independent visit with no supervising physician on site.  The offsite supervising/collaborating physician for this visit was Earlie Server. Lucia Bitter co-signs some notes as well.    Parts of this note were dictated.  Please inquire for clarification if any discrepancies in transcription may have occurred.    [name] Nutting, PA-C

## 2022-10-08 NOTE — Nursing Note (Signed)
Pt states he has a tooth ache that might be infected. It been going on for about 2 weeks but the infection and swelling started last night Tried to get to the dentist but ws unable. Pt has taken Advil 2     Randolm Idol, [place] 10/08/2022 15:17

## 2022-10-30 ENCOUNTER — Ambulatory Visit (HOSPITAL_COMMUNITY): Payer: BC Managed Care – PPO | Attending: Cardiology

## 2022-10-30 DIAGNOSIS — R9431 Abnormal electrocardiogram [ECG] [EKG]: Secondary | ICD-10-CM | POA: Insufficient documentation

## 2022-10-30 LAB — ECHOCARDIOGRAM COMPLETE
Area-P 1/2: 2.57 cm2
S' Lateral: 3.2 cm

## 2023-08-13 ENCOUNTER — Encounter (INDEPENDENT_AMBULATORY_CARE_PROVIDER_SITE_OTHER): Payer: Self-pay | Admitting: Student in an Organized Health Care Education/Training Program

## 2023-08-13 ENCOUNTER — Other Ambulatory Visit: Payer: Self-pay

## 2023-08-13 ENCOUNTER — Ambulatory Visit (INDEPENDENT_AMBULATORY_CARE_PROVIDER_SITE_OTHER): Admitting: Student in an Organized Health Care Education/Training Program

## 2023-08-13 VITALS — BP 132/90 | HR 90 | Temp 99.0°F | Ht 70.87 in | Wt 253.2 lb

## 2023-08-13 DIAGNOSIS — H6091 Unspecified otitis externa, right ear: Secondary | ICD-10-CM

## 2023-08-13 MED ORDER — NEOMYCIN-POLYMYXIN-HYDROCORT 3.5 MG-10,000 UNIT/ML-1 % EAR DROPS,SUSP
3.0000 [drp] | Freq: Four times a day (QID) | OTIC | 0 refills | Status: AC
Start: 2023-08-13 — End: 2023-08-20

## 2023-08-13 NOTE — Progress Notes (Signed)
 [address] [facility] [address]  [address] [address]  [facility] [place] [id number]            History of Present Illness: [name] [name] is a 38 y.o. male who presents to the Urgent Care today with chief complaint of Ear Problem(s)       History of Present Illness    The patient presents to the clinic today with concerns about his right ear. He reports experiencing decreased hearing, a little bit of pain, and some drainage from the affected ear. The patient states that he has had a cotton ball in the ear for a few days. The symptoms began on Friday night after showering, when the patient felt like he had water trapped in his ear. Despite attempts to remove it, the water sensation persisted. The patient notes swelling in the ear area, specifically mentioning that the gland near the ear (likely referring to a lymph node) has become enlarged. He expresses concern about these symptoms and the possibility of an infection. The patient's symptoms have been present for at least a few days, given that he mentions using a cotton ball in the ear during this time.              The history was provided by the patient   I reviewed and confirmed the patient's past medical history taken by the nurse or medical assistant with the addition of the following:    Past Medical History:   Diagnosis Date    Apical lung scarring     Asthma     Seizures             Past Surgical History:   Procedure Laterality Date    HX FOOT SURGERY              Allergies   Allergen Reactions    Tramadol Rash    Dilantin [Phenytoin] Hives/ Urticaria    Tussin [Guaifenesin] Hives/ Urticaria     Childhood Allergy        Current Outpatient Medications   Medication Instructions    neomycin-polymyxin-HC (CORTISPORIN) 3.5-10,000-1 mg/mL-unit/mL-% Otic Drops, Suspension 3 Drops, Right Ear, 4 TIMES DAILY        Social History     Socioeconomic History    Marital status: Significant Other   Occupational History     Employer: [employer]   Tobacco Use     Smoking status: Every Day     Current packs/day: 0.50     Average packs/day: 0.5 packs/day for 6.0 years (3.0 ttl pk-yrs)     Types: Cigarettes    Smokeless tobacco: Never   Vaping Use    Vaping status: Former   Substance and Sexual Activity    Alcohol use: Not Currently     Alcohol/week: 2.5 standard drinks of alcohol     Types: 3 Cans of beer per week    Drug use: Never     Social Determinants of Health     Financial Resource Strain: Low Risk  (08/13/2023)    Financial Resource Strain     SDOH Financial: No   Transportation Needs: Low Risk  (08/13/2023)    Transportation Needs     SDOH Transportation: No   Social Connections: Low Risk  (08/13/2023)    Social Connections     SDOH Social Isolation: 5 or more times a week   Intimate Partner Violence: Low Risk  (08/13/2023)    Intimate Partner Violence     SDOH Domestic Violence: No  Housing Stability: Low Risk  (08/13/2023)    Housing Stability     SDOH Housing Situation: I have housing.     SDOH Housing Worry: No        Review of Systems:     Other than ROS in HPI, all other systems are negative.      Vital signs:   Vitals:    08/13/23 1726   BP: (!) 132/90   Pulse: 90   Temp: 37.2 C (99 F)   SpO2: 95%   Weight: 115 kg (253 lb 3.2 oz)   Height: 1.8 m (5' 10.87")   BMI: 35.45         Body mass index is 35.45 kg/m.   Facility age limit for growth %iles is 20 years.  No LMP for male patient.    Physical Exam  Vitals reviewed.   Constitutional:       General: He is not in acute distress.  HENT:      Head: Normocephalic and atraumatic.      Right Ear: Tympanic membrane, ear canal and external ear normal. Drainage, swelling and tenderness present.      Left Ear: Tympanic membrane, ear canal and external ear normal.   Eyes:      Extraocular Movements: Extraocular movements intact.      Pupils: Pupils are equal, round, and reactive to light.   Cardiovascular:      Rate and Rhythm: Normal rate and regular rhythm.   Pulmonary:      Effort: Pulmonary effort is normal.      Breath  sounds: Normal breath sounds.   Neurological:      Mental Status: He is alert.         Data Reviewed / POCT Results:    None      I have reviewed and confirmed the above point of care results.  Lawrence Pretty, PA-C    Assessment & Plan    Austine was seen today for ear problem(s).    Diagnoses and all orders for this visit:    1. Right otitis externa (Primary)  -     neomycin-polymyxin-HC (CORTISPORIN) 3.5-10,000-1 mg/mL-unit/mL-% Otic Drops, Suspension; Administer 3 Drops into the right ear Four times a day for 7 days      - Reassure patient that the internal ear looks good, and the problem is confined to the external ear canal.         *Patient verbalized understanding of the assessment and plan.  *If symptoms are worsening or not improving the patient should return to the Urgent Care for further evaluation.  *Go to the Emergency Department immediately for further work up if any concerning symptoms develop.     APPID-Independent visit with no supervising physician on site   The offsite supervising and collaborating physician for this visit was Dr. Jonny Neu, MD     Lawrence Pretty, PA-C 08/13/2023, 17:39    This note was created using [employer] M*Modal Fluency [employer]ign mobile application via conversational audio. Consent for audio recording was obtained by patient/family members prior to recording.

## 2023-08-13 NOTE — Nursing Note (Signed)
 Sx's started on Friday night. C/O right ear pain, pressure, decreased hearing. Denies tinnitus, fever, and chills. Pt reports that he woke up feeling hot last night. Pt has not been sick recently. Pt states that the ear has been draining yellow discharge. He has been putting a cotton ball in the ear to keep out the cold and wind. The cold air worsens the pain. Pt believes this is swimmer's ear because it started following a shower. The pain is throbbing.

## 2024-02-10 ENCOUNTER — Ambulatory Visit (INDEPENDENT_AMBULATORY_CARE_PROVIDER_SITE_OTHER): Payer: Self-pay

## 2024-02-10 ENCOUNTER — Other Ambulatory Visit: Payer: Self-pay

## 2024-02-10 DIAGNOSIS — Z021 Encounter for pre-employment examination: Secondary | ICD-10-CM

## 2024-02-10 NOTE — Nursing Note (Signed)
Urine collected and sent via fedex for drug screening - ENB Breath alcohol test performed
